# Patient Record
Sex: Female | Born: 1975 | Race: White | Hispanic: No | Marital: Married | Smoking: Never smoker
Health system: Southern US, Community
[De-identification: ages and names within clinical notes are randomized; demographics above are authoritative.]

## PROBLEM LIST (undated history)

## (undated) DIAGNOSIS — J302 Other seasonal allergic rhinitis: Secondary | ICD-10-CM

## (undated) DIAGNOSIS — E079 Disorder of thyroid, unspecified: Secondary | ICD-10-CM

## (undated) DIAGNOSIS — Z5189 Encounter for other specified aftercare: Secondary | ICD-10-CM

## (undated) DIAGNOSIS — R05 Cough: Principal | ICD-10-CM

## (undated) DIAGNOSIS — Z1379 Encounter for other screening for genetic and chromosomal anomalies: Principal | ICD-10-CM

## (undated) HISTORY — PX: HEMANGIOMA EXCISION: SHX1734

## (undated) HISTORY — PX: TONSILLECTOMY: SUR1361

## (undated) HISTORY — DX: Cough: R05

## (undated) HISTORY — DX: Encounter for other screening for genetic and chromosomal anomalies: Z13.79

## (undated) HISTORY — DX: Encounter for other specified aftercare: Z51.89

## (undated) HISTORY — DX: Other seasonal allergic rhinitis: J30.2

## (undated) HISTORY — PX: HEMORRHOID BANDING: SHX5850

## (undated) HISTORY — DX: Disorder of thyroid, unspecified: E07.9

---

## 1997-04-26 ENCOUNTER — Other Ambulatory Visit: Admission: RE | Admit: 1997-04-26 | Discharge: 1997-04-26 | Payer: Self-pay | Admitting: Obstetrics and Gynecology

## 1998-01-01 ENCOUNTER — Emergency Department (HOSPITAL_COMMUNITY): Admission: EM | Admit: 1998-01-01 | Discharge: 1998-01-01 | Payer: Self-pay | Admitting: Emergency Medicine

## 1998-05-28 ENCOUNTER — Emergency Department (HOSPITAL_COMMUNITY): Admission: EM | Admit: 1998-05-28 | Discharge: 1998-05-28 | Payer: Self-pay | Admitting: Emergency Medicine

## 1999-01-05 ENCOUNTER — Other Ambulatory Visit: Admission: RE | Admit: 1999-01-05 | Discharge: 1999-01-05 | Payer: Self-pay | Admitting: Obstetrics and Gynecology

## 1999-06-30 ENCOUNTER — Observation Stay (HOSPITAL_COMMUNITY): Admission: AD | Admit: 1999-06-30 | Discharge: 1999-07-01 | Payer: Self-pay | Admitting: Obstetrics & Gynecology

## 1999-07-01 ENCOUNTER — Encounter: Payer: Self-pay | Admitting: Obstetrics & Gynecology

## 1999-09-11 ENCOUNTER — Inpatient Hospital Stay (HOSPITAL_COMMUNITY): Admission: AD | Admit: 1999-09-11 | Discharge: 1999-09-14 | Payer: Self-pay | Admitting: Obstetrics and Gynecology

## 1999-09-16 ENCOUNTER — Encounter: Admission: RE | Admit: 1999-09-16 | Discharge: 1999-10-13 | Payer: Self-pay | Admitting: Obstetrics and Gynecology

## 1999-10-20 ENCOUNTER — Other Ambulatory Visit: Admission: RE | Admit: 1999-10-20 | Discharge: 1999-10-20 | Payer: Self-pay | Admitting: Obstetrics and Gynecology

## 2002-12-31 ENCOUNTER — Other Ambulatory Visit: Admission: RE | Admit: 2002-12-31 | Discharge: 2002-12-31 | Payer: Self-pay | Admitting: Obstetrics and Gynecology

## 2004-07-17 ENCOUNTER — Other Ambulatory Visit: Admission: RE | Admit: 2004-07-17 | Discharge: 2004-07-17 | Payer: Self-pay | Admitting: Obstetrics and Gynecology

## 2006-11-05 ENCOUNTER — Encounter: Admission: RE | Admit: 2006-11-05 | Discharge: 2006-11-05 | Payer: Self-pay | Admitting: Orthopedic Surgery

## 2007-06-15 ENCOUNTER — Inpatient Hospital Stay (HOSPITAL_COMMUNITY): Admission: AD | Admit: 2007-06-15 | Discharge: 2007-06-15 | Payer: Self-pay | Admitting: Obstetrics and Gynecology

## 2007-06-21 ENCOUNTER — Ambulatory Visit (HOSPITAL_COMMUNITY): Admission: RE | Admit: 2007-06-21 | Discharge: 2007-06-21 | Payer: Self-pay | Admitting: Obstetrics & Gynecology

## 2007-07-06 ENCOUNTER — Ambulatory Visit (HOSPITAL_COMMUNITY): Admission: RE | Admit: 2007-07-06 | Discharge: 2007-07-06 | Payer: Self-pay | Admitting: Obstetrics and Gynecology

## 2007-07-15 ENCOUNTER — Inpatient Hospital Stay (HOSPITAL_COMMUNITY): Admission: AD | Admit: 2007-07-15 | Discharge: 2007-07-15 | Payer: Self-pay | Admitting: Obstetrics and Gynecology

## 2007-07-20 ENCOUNTER — Ambulatory Visit (HOSPITAL_COMMUNITY): Admission: RE | Admit: 2007-07-20 | Discharge: 2007-07-20 | Payer: Self-pay | Admitting: Obstetrics and Gynecology

## 2007-07-30 ENCOUNTER — Inpatient Hospital Stay (HOSPITAL_COMMUNITY): Admission: AD | Admit: 2007-07-30 | Discharge: 2007-07-30 | Payer: Self-pay | Admitting: Obstetrics and Gynecology

## 2007-08-09 ENCOUNTER — Ambulatory Visit (HOSPITAL_COMMUNITY): Admission: RE | Admit: 2007-08-09 | Discharge: 2007-08-09 | Payer: Self-pay | Admitting: Obstetrics and Gynecology

## 2007-08-17 ENCOUNTER — Inpatient Hospital Stay (HOSPITAL_COMMUNITY): Admission: AD | Admit: 2007-08-17 | Discharge: 2007-09-18 | Payer: Self-pay | Admitting: Obstetrics and Gynecology

## 2007-08-17 ENCOUNTER — Ambulatory Visit: Payer: Self-pay | Admitting: Cardiology

## 2007-08-17 ENCOUNTER — Ambulatory Visit: Payer: Self-pay | Admitting: Pulmonary Disease

## 2007-08-18 ENCOUNTER — Encounter (HOSPITAL_COMMUNITY): Payer: Self-pay | Admitting: Obstetrics and Gynecology

## 2007-08-21 ENCOUNTER — Other Ambulatory Visit: Payer: Self-pay | Admitting: Family Medicine

## 2007-08-22 ENCOUNTER — Encounter (INDEPENDENT_AMBULATORY_CARE_PROVIDER_SITE_OTHER): Payer: Self-pay | Admitting: Obstetrics and Gynecology

## 2007-08-23 ENCOUNTER — Other Ambulatory Visit: Payer: Self-pay | Admitting: Family Medicine

## 2007-08-27 ENCOUNTER — Ambulatory Visit: Payer: Self-pay | Admitting: Surgery

## 2007-08-27 ENCOUNTER — Encounter (HOSPITAL_COMMUNITY): Payer: Self-pay | Admitting: Obstetrics and Gynecology

## 2007-08-28 ENCOUNTER — Encounter (HOSPITAL_COMMUNITY): Payer: Self-pay | Admitting: Obstetrics and Gynecology

## 2007-09-11 ENCOUNTER — Encounter (INDEPENDENT_AMBULATORY_CARE_PROVIDER_SITE_OTHER): Payer: Self-pay | Admitting: Obstetrics and Gynecology

## 2007-09-11 ENCOUNTER — Encounter: Payer: Self-pay | Admitting: Obstetrics and Gynecology

## 2007-09-19 ENCOUNTER — Encounter: Admission: RE | Admit: 2007-09-19 | Discharge: 2007-09-25 | Payer: Self-pay | Admitting: Obstetrics and Gynecology

## 2009-04-10 IMAGING — CR DG CHEST 2V
2 series · 2 of 2 positions shown · non-contrast
Comparison: PA and lateral chest 08/22/2007 and 08/21/2007.

CLINICAL DATA: Shortness of breath.

CHEST - 2 VIEW

[view not recorded (1 of 2)]
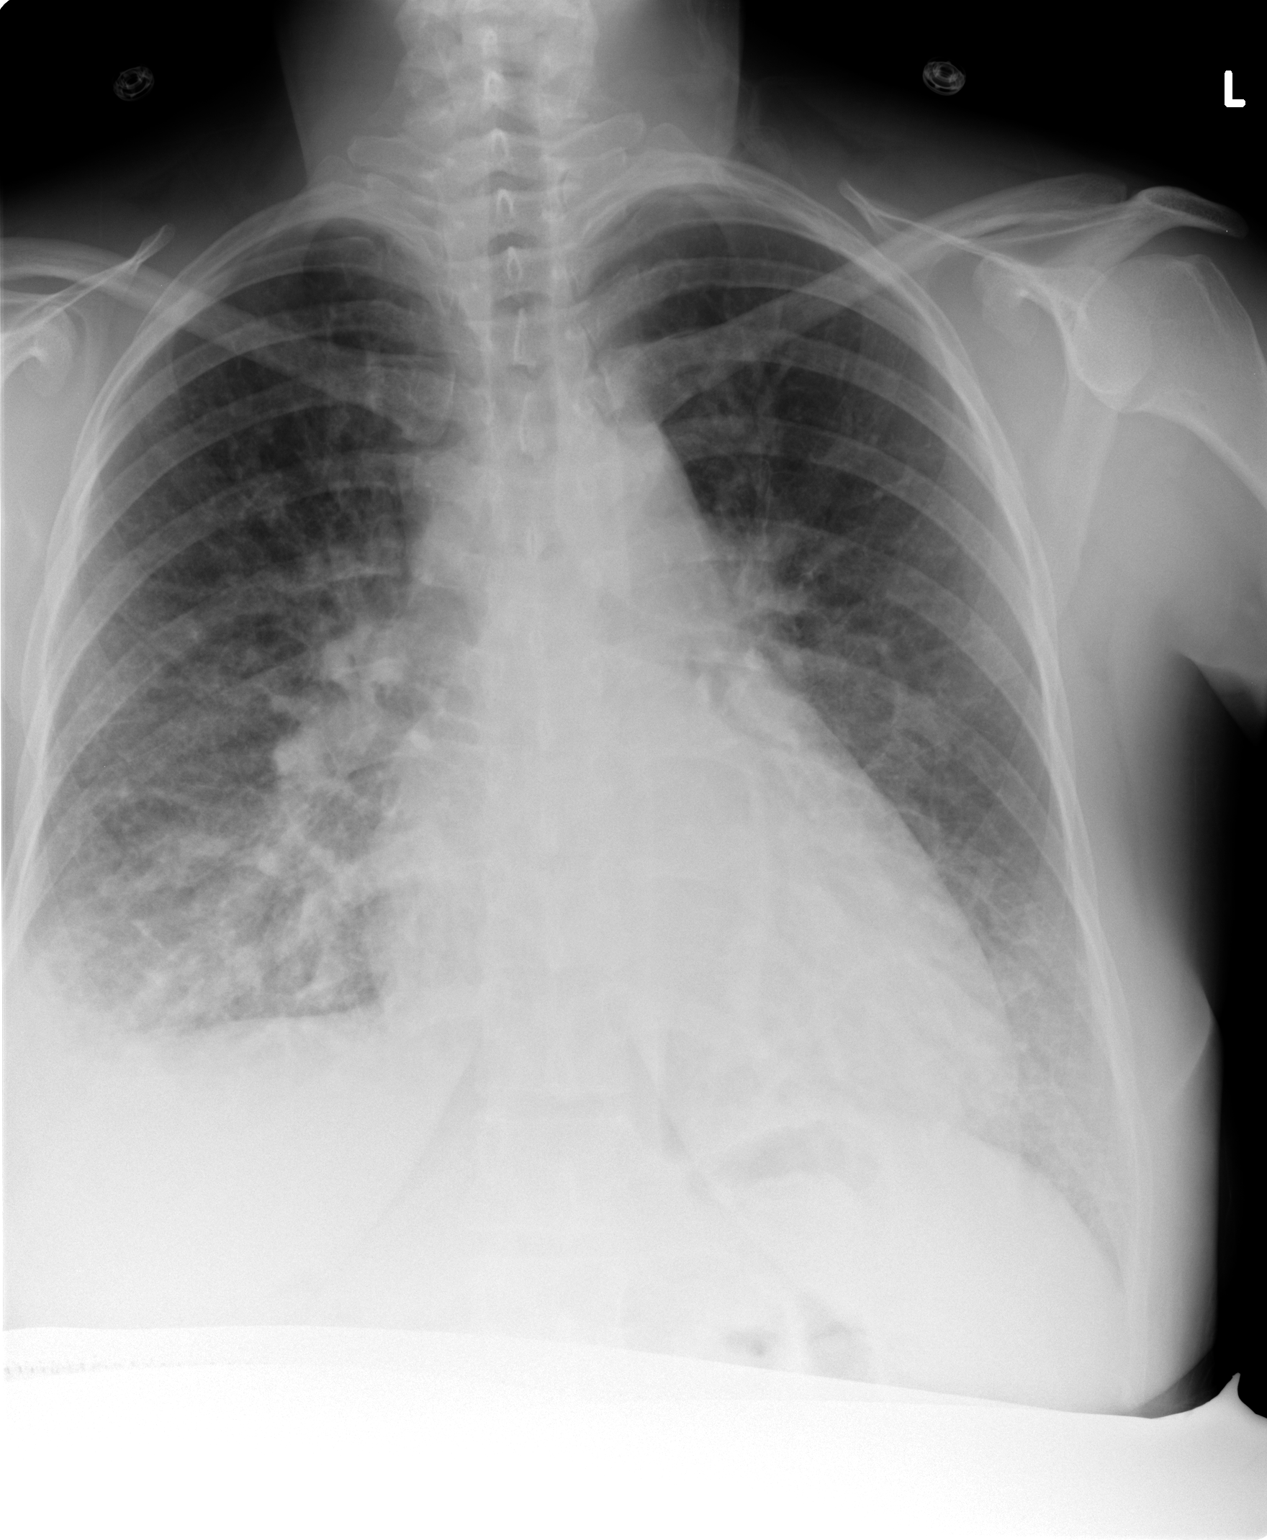

[view not recorded (2 of 2)]
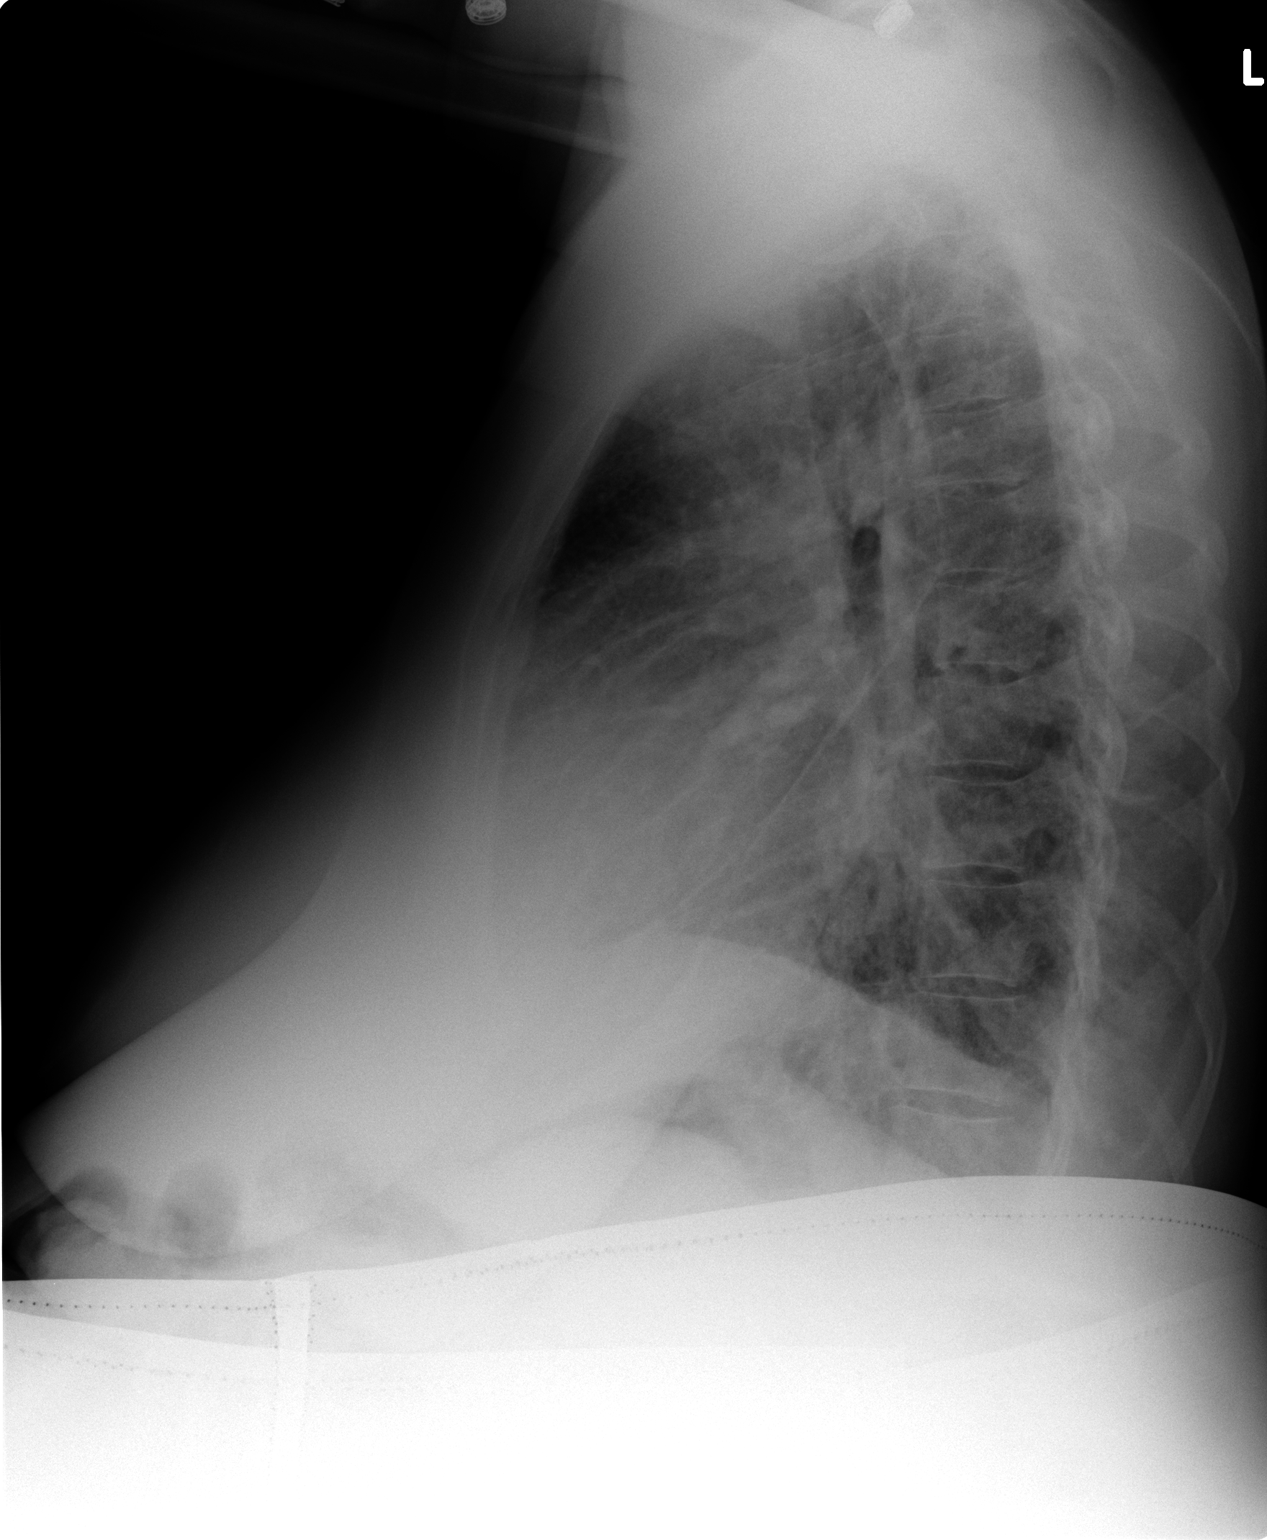

[2 of 2 positions shown; findings below may reference images not displayed]

FINDINGS: There has been marked worsening in bibasilar aeration,
particularly in the right lung base.  Cardiomegaly.  No effusion is
visualized.
IMPRESSION: Marked worsening of bibasilar aeration could represent increasing
pulmonary edema.  Focal opacity the right base is worrisome for
pneumonia.

## 2009-04-12 IMAGING — CR DG CHEST 2V
2 series · 2 of 2 positions shown · non-contrast
Comparison: 08/26/2007, 08/22/2007 and 08/21/2007

CLINICAL DATA: Pulmonary edema, 34 weeks pregnant.

CHEST - 2 VIEW

[view not recorded (1 of 2)]
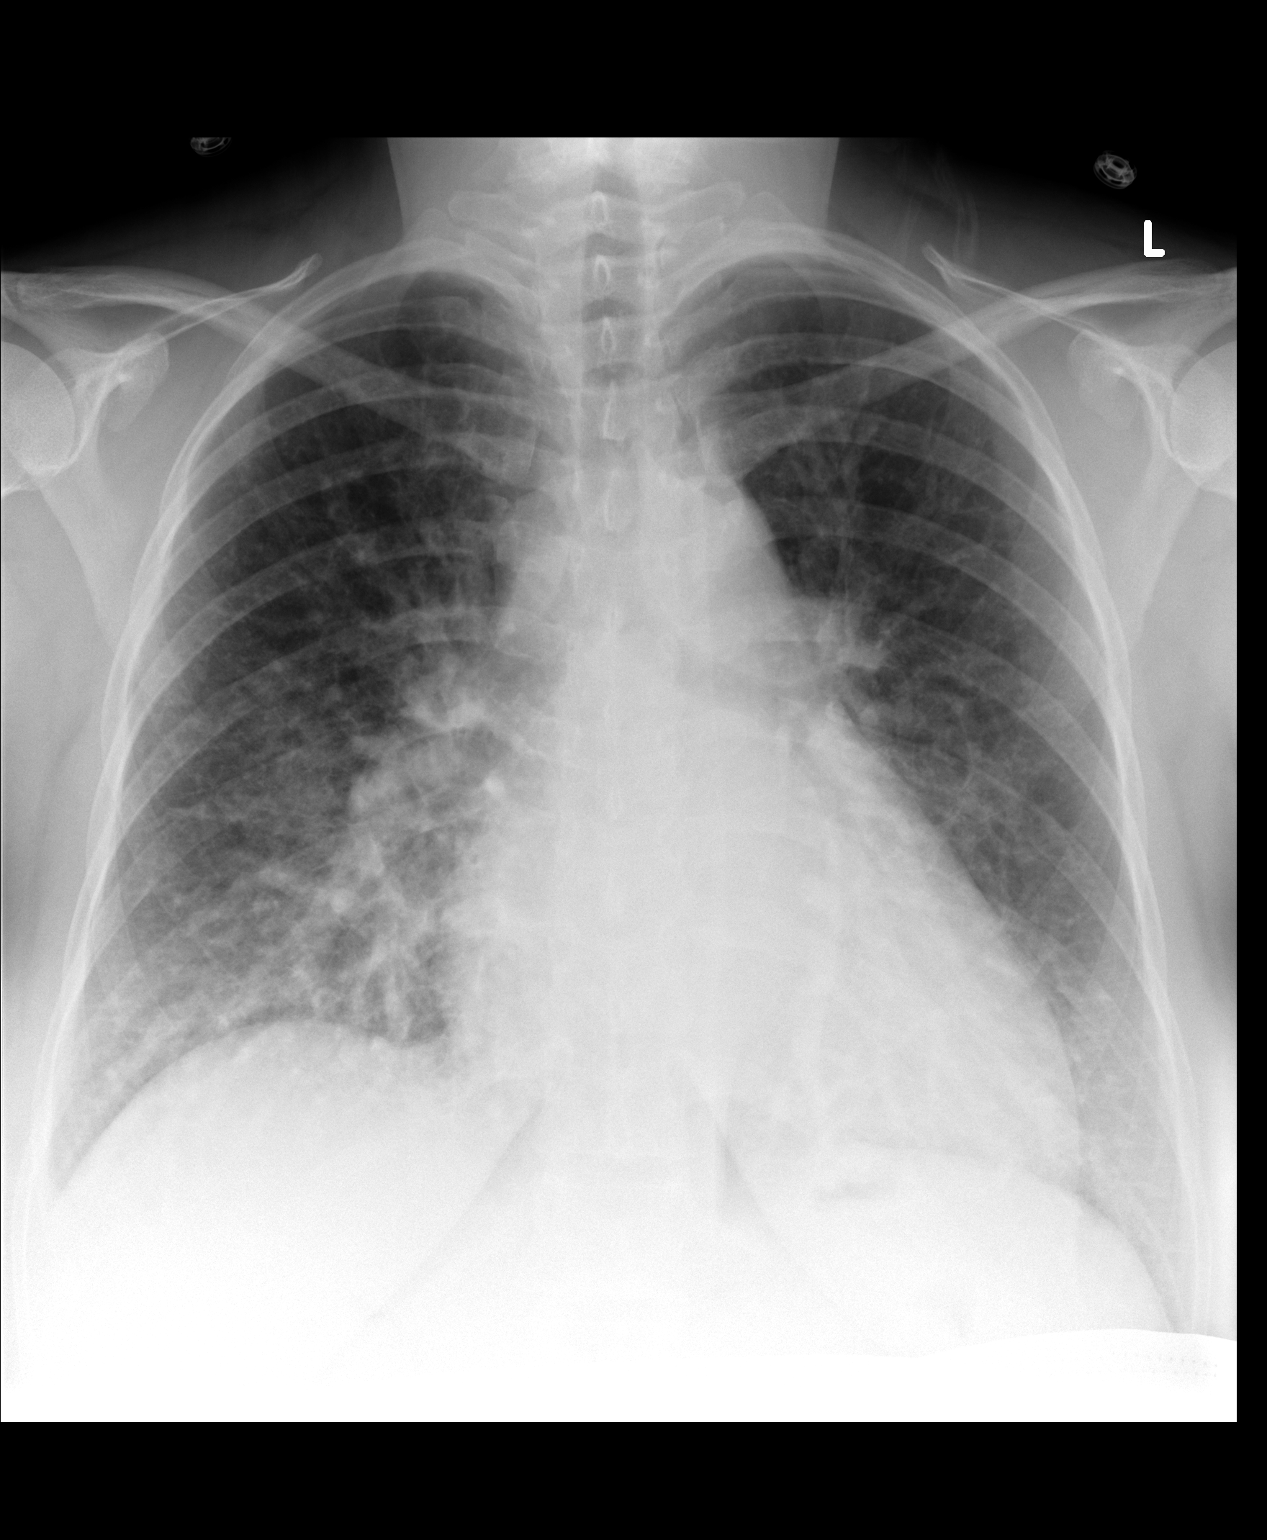

[view not recorded (2 of 2)]
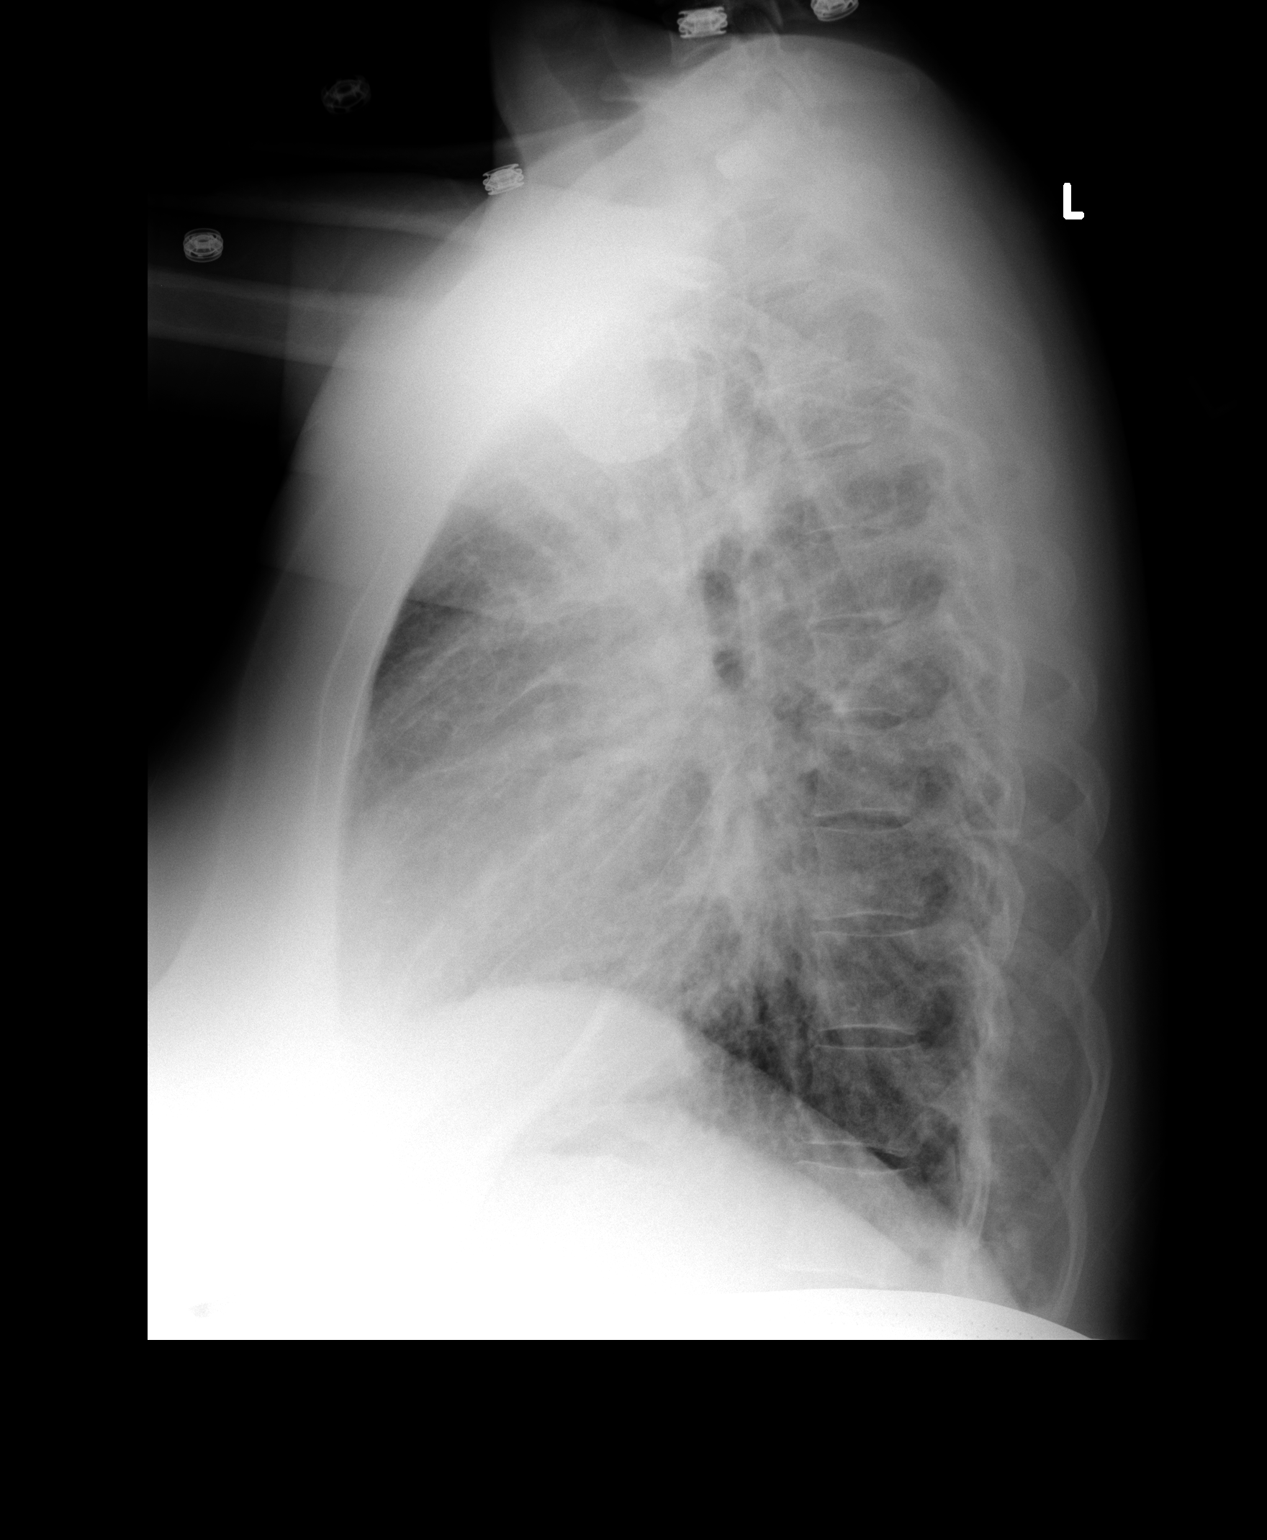

[2 of 2 positions shown; findings below may reference images not displayed]

FINDINGS: Trachea is midline.  Heart size stable.  There is
interstitial prominence and indistinctness, with basilar
predominance.  Pleural effusions have resolved.  Aeration in the
right lower lobe has improved somewhat in the interval.
IMPRESSION: Improving edema and effusions.

## 2009-04-29 IMAGING — CR DG CHEST 2V
2 series · 2 of 2 positions shown · non-contrast
Comparison: 08/28/2007

CLINICAL DATA: Post C-section.  Patient coughing shortness of
breath and green sputum.

CHEST - 2 VIEW

[view not recorded (1 of 2)]
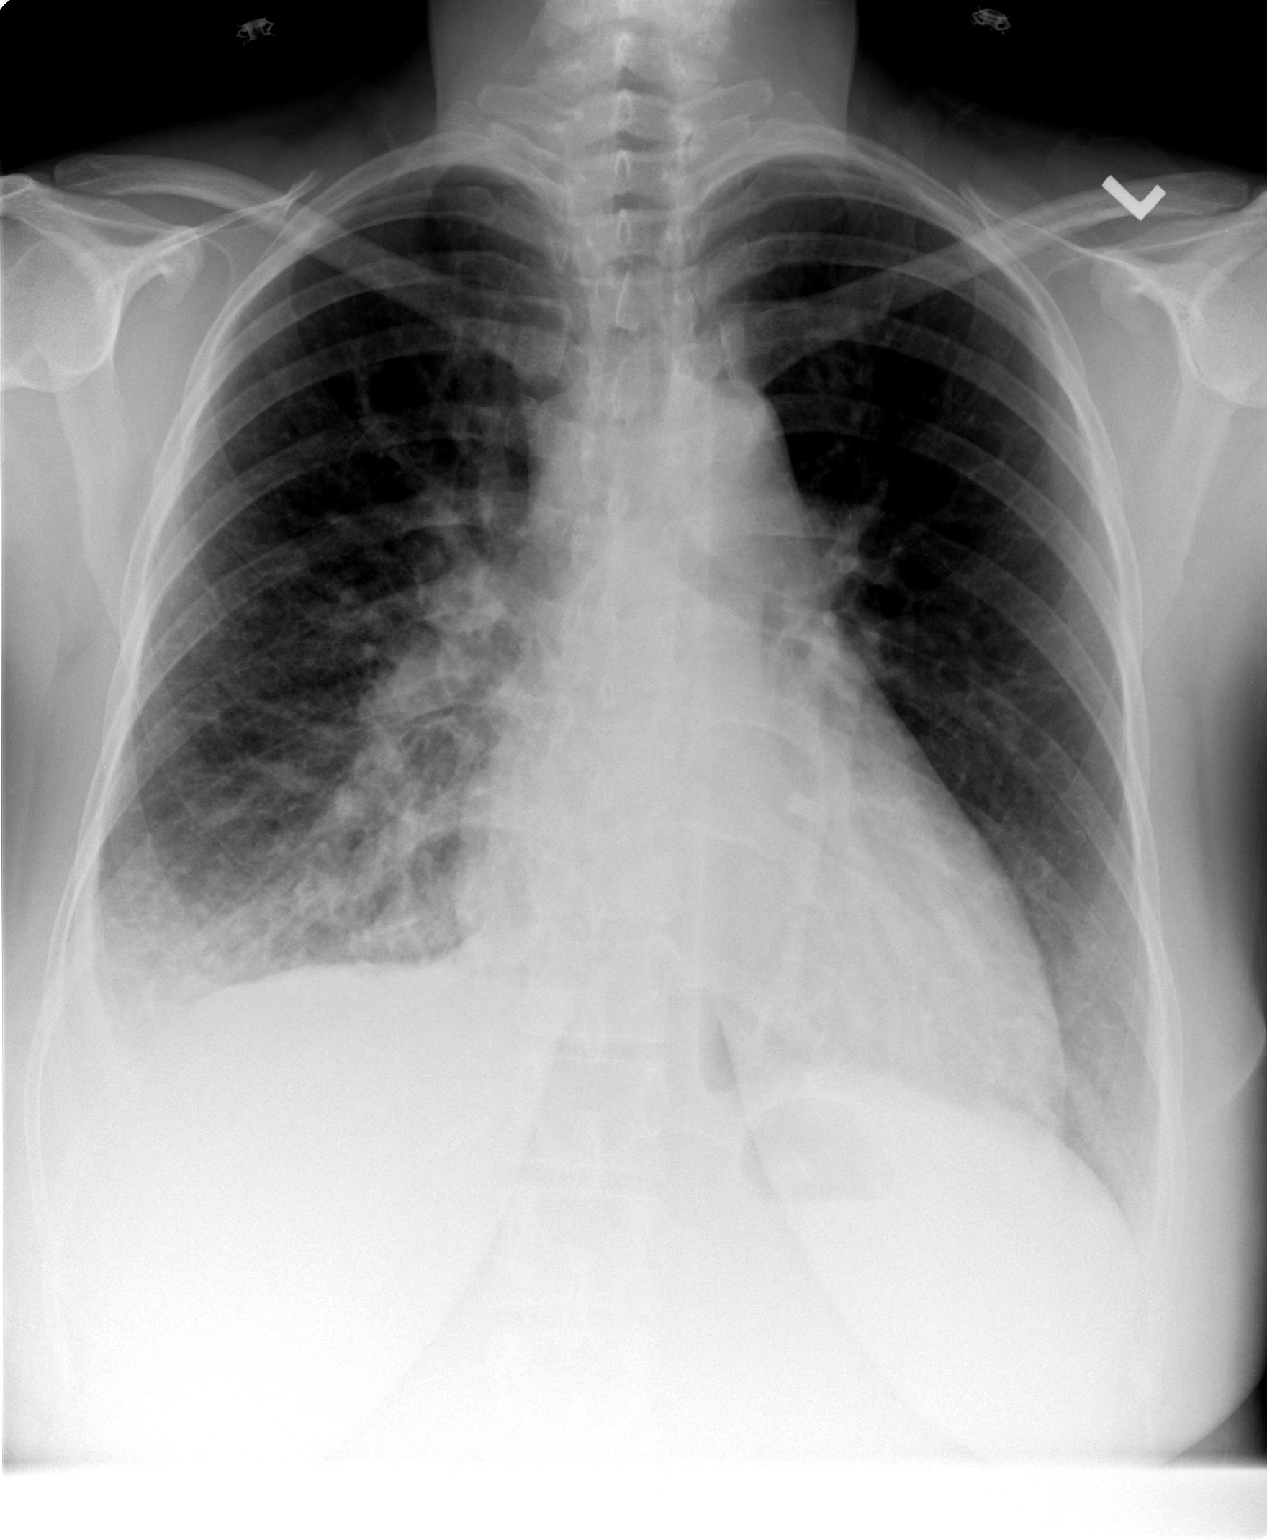

[view not recorded (2 of 2)]
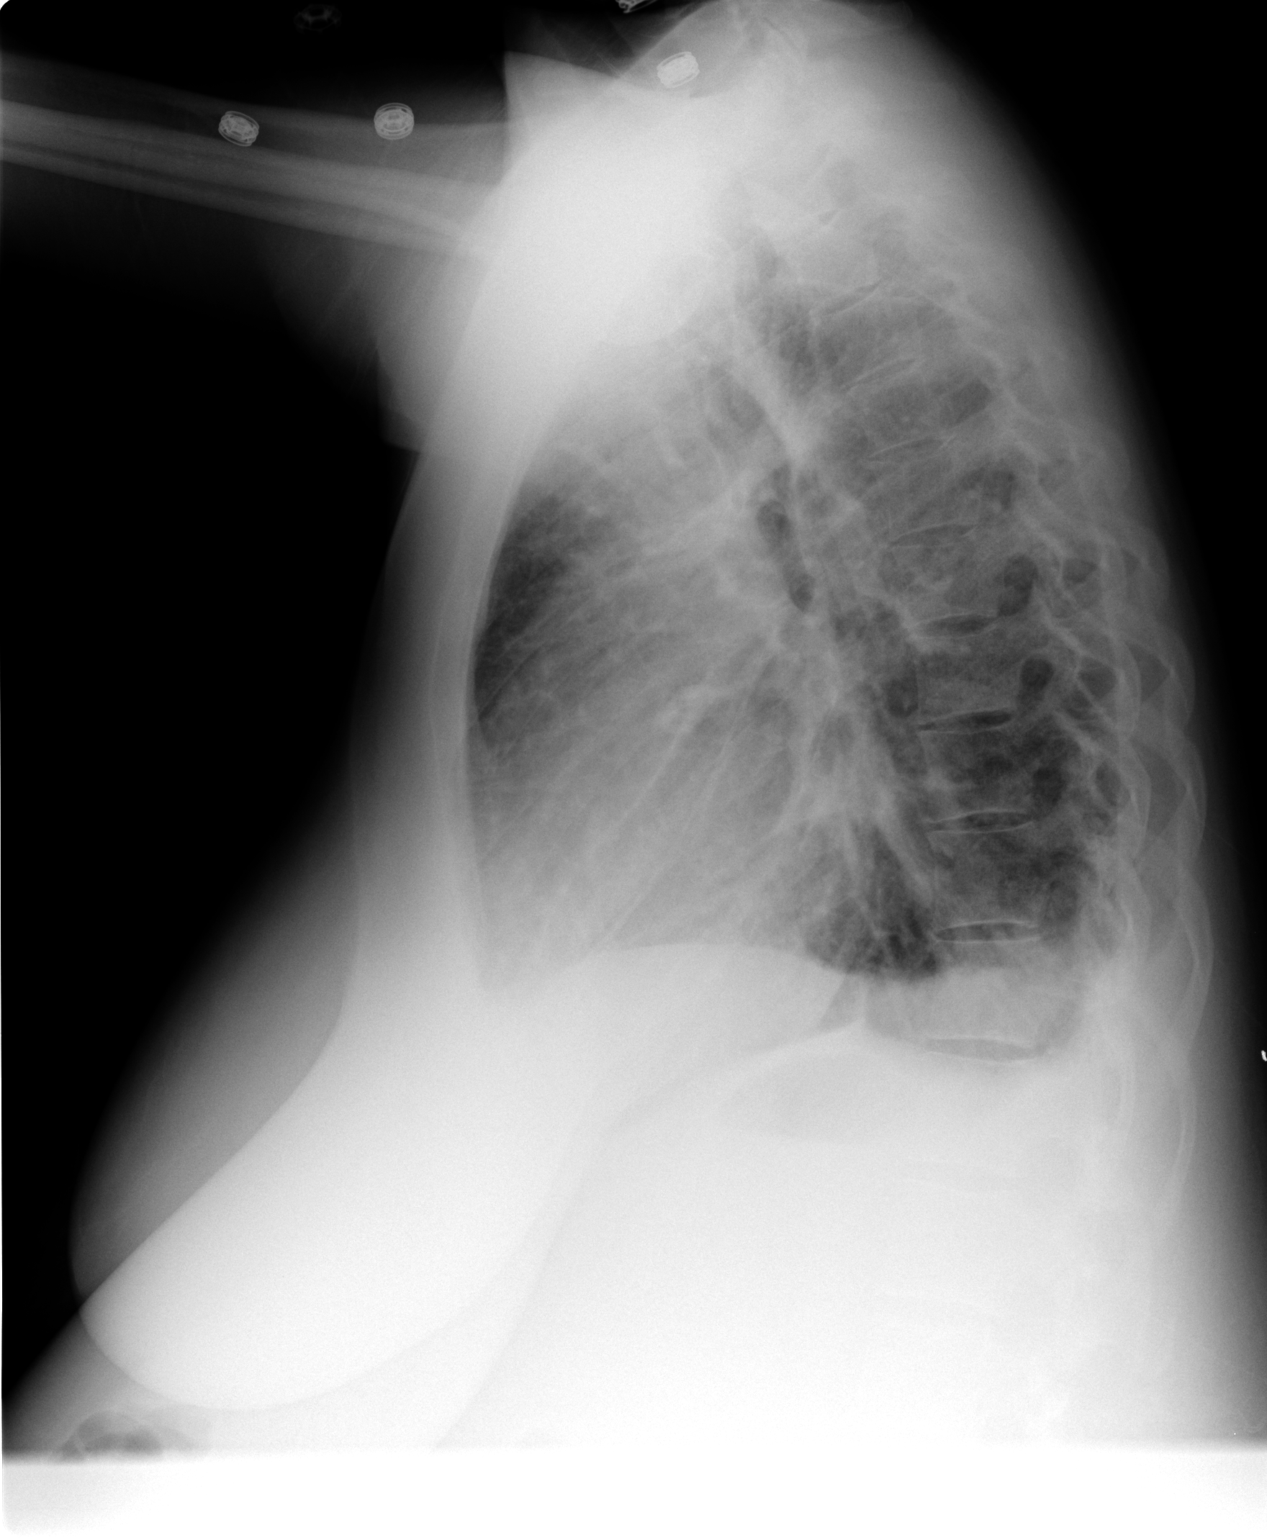

[2 of 2 positions shown; findings below may reference images not displayed]

FINDINGS: Mild cardiomegaly is again identified and is stable in
degree.  Bilateral pleural pleural effusions, right greater than
left are noted.  Focal alveolar infiltrate is identified at the
right lung base with associated septal lines in the appearance is
most compatible with asymmetric pulmonary edema.  Is the patient
lying with the right side down?  The possibility of a focal
pneumonia would be a secondary but felt less likely consideration
given the overall appearance.

Bony structures appear intact.
IMPRESSION: Bilateral pleural effusions with right basilar interstitial edema.
Probable asymmetric alveolar pulmonary edema at the right lung
base.  Please see above report for discussion.

## 2009-11-28 ENCOUNTER — Ambulatory Visit (HOSPITAL_COMMUNITY)
Admission: RE | Admit: 2009-11-28 | Discharge: 2009-11-28 | Payer: Self-pay | Source: Home / Self Care | Admitting: Obstetrics and Gynecology

## 2010-02-23 ENCOUNTER — Encounter: Payer: Self-pay | Admitting: Obstetrics and Gynecology

## 2010-04-15 LAB — HCG, SERUM, QUALITATIVE: Preg, Serum: NEGATIVE

## 2010-04-15 LAB — CBC
Platelets: 220 10*3/uL (ref 150–400)
RBC: 4.32 MIL/uL (ref 3.87–5.11)
RDW: 16.4 % — ABNORMAL HIGH (ref 11.5–15.5)
WBC: 5.2 10*3/uL (ref 4.0–10.5)

## 2010-06-15 ENCOUNTER — Other Ambulatory Visit: Payer: Self-pay | Admitting: Family Medicine

## 2010-06-15 ENCOUNTER — Ambulatory Visit
Admission: RE | Admit: 2010-06-15 | Discharge: 2010-06-15 | Disposition: A | Discharge status: Home / Self Care | Payer: PRIVATE HEALTH INSURANCE | Source: Ambulatory Visit | Attending: Family Medicine | Admitting: Family Medicine

## 2010-06-15 DIAGNOSIS — J189 Pneumonia, unspecified organism: Secondary | ICD-10-CM

## 2010-06-15 MED ORDER — IOHEXOL 300 MG/ML  SOLN
75.0000 mL | Freq: Once | INTRAMUSCULAR | Status: AC | PRN
  Administered 2010-06-15: 75 mL via INTRAVENOUS

## 2010-06-16 NOTE — Op Note (Signed)
NAME:  [name], [name]               ACCOUNT NO.:  [id number]   MEDICAL RECORD NO.:  [id number]           PATIENT TYPE:   LOCATION:                                FACILITY:  [facility]   PHYSICIAN:  Guy Sandifer. Henderson Cloud, M.D. DATE OF BIRTH:  [birth year]   DATE OF PROCEDURE:  DATE OF DISCHARGE:                               OPERATIVE REPORT   PREOPERATIVE DIAGNOSES:  1. Intrauterine pregnancy at 33-4/7 weeks estimated gestational age.  2. Fetal hydrops.   POSTOPERATIVE DIAGNOSIS:  1. Intrauterine pregnancy at 33-4/7 weeks estimated gestational age.  2. Fetal hydrops.   PROCEDURE:  Low-transverse cesarean section.   SURGEON:  Guy Sandifer. Henderson Cloud, MD   ASSISTANT:  Juluis Mire, MD   ANESTHESIA:  Spinal, Burnett Corrente, MD   SPECIMENS:  Placenta to pathology.  A section of umbilical cord for  karyotype, amniotic fluid for culture and sensitivity, cord blood for  culture and sensitivity to the laboratory.   FINDINGS:  Viable female infant, Apgars of 4, 4 and 7 at 1, 5 and 10  minutes respectively.  Arterial pH pending.  Birth weight pending.   ESTIMATED BLOOD LOSS:  1000 mL.   INDICATIONS AND CONSENT:  This patient is a 35 year old married white  female G71, P2, who was admitted on August 17, 2007, with polyhydramnios  and preterm uterine contractions.  The patient received betamethasone.  She initially had magnesium sulfate for labor tocolysis.  This was  followed by an episode of pulmonary edema.  She received approximately 2  units of packed red blood cells in transfusion.  The pulmonary edema  resolved.  The polyhydramnios continued.  She has been followed with  maternal fetal medicine.  Repeated ultrasounds have revealed normal  anatomy on the fetus.  The patient had declined karyotype and other  diagnostic measures on the baby.  Her last ultrasound on August 28, 2007,  revealed an estimated fetal weight of 4 pounds 2 ounces at the 78th  percentile.  Polyhydramnios continued.  Anatomy again  appeared normal.  Today's ultrasound revealed fetal hydrops.  Recommendation by maternal  fetal medicine, Dr. Ander Slade a was for immediate delivery.  This was  discussed with the patient.  Potential risks and complications were  discussed preoperatively including but not limited to infection, organ  damage, bleeding requiring transfusion of blood products with possible  HIV and hepatitis acquisition, DVT, PE, and pneumonia.  All questions  were answered and consent was signed on the chart.  CBC and metabolic  panel withdrawn.  The patient is crossmatch for 2 units of blood.   PROCEDURE:  The patient was taken immediately to the operating room  where she was identified, spinal anesthetic was placed and she was  placed in dorsal supine position with a 15-degree left lateral wedge.  The panniculus, which was edematous was elevated with tape.  She was  prepped.  Foley catheter was placed in the bladder as a drain, and she  was draped in a sterile fashion.  Pfannenstiel incision was made and  dissection was carried out through edematous adipose tissue down to the  peritoneum.  Peritoneum was incised and extended superiorly and  inferiorly.  Vesicouterine peritoneum was taken down cephalad laterally.  The bladder flap was developed and the bladder blade was placed.  Uterus  was incised in a low transverse manner.  The uterine cavity was entered  bluntly with a hemostat.  The uterine incision was extended cephalad  laterally with fingers.  Artificial rupture of membranes was carried out  for copious amounts of clear to straw-colored fluid.  The vertex which  was quite edematous was easily delivered through the uterine incision.  The baby was then delivered without difficulty.  Cord was clamped and  cut.  The baby handed to the waiting pediatrics team.  An approximately  1-inch section of umbilical cord was removed and will be sent for  karyotype.  Routine cord blood and cord gases withdrawn.   Amniotic fluid  as well as a cord blood sample were also obtained for cultures.  The  uterine cavity was clean.  Uterus was closed in 2 running locking  imbricating layers of 0-Monocryl suture, which achieved good hemostasis.  Tubes and ovaries were normal bilaterally.  Anterior peritoneum was  closed in running fashion with 0-Monocryl suture, which was also used to  reapproximate the pyramidalis muscle in the midline.  Anterior rectus  fascia was closed in a running fashion with 0-PDS loop suture.  A 2-0  plain suture was used to close the subcutaneous layer and the skin was  closed with clips.  All sponge, instrument, and needle counts were  correct and the patient was transferred to recovery room in stable  condition.      Guy Sandifer Henderson Cloud, M.D.  Electronically Signed     JET/MEDQ  D:  09/11/2007  T:  09/12/2007  Job:  [id number]

## 2010-06-19 NOTE — H&P (Signed)
[facility]  Patient:    [name], [name]                        MRN: [id number] Adm. Date:  06/30/99 Attending:  Freddy Finner, M.D.                         History and Physical  ADMITTING DIAGNOSES:          1.  Intrauterine pregnancy at [redacted] weeks                                   gestation.                               2.  Suspected left nephrolithiasis or left                                   ovarian torsion.  HISTORY OF PRESENT ILLNESS:   The patient is a 35 year old white married female, primigravida, who has had an uncomplicated prenatal course until today.  She did have some mild menstrual-like cramping on the day prior to her presentation, but was feeling better this morning.  She left the house to go pick strawberries and reported the sudden onset of severe left flank and left lower abdominal pain.  She went home, rested for awhile, and awakened with recurrence of even more severe pain.  She presented to the triage area of [facility] and was noted by the nurse to be writhing in pain on the bed, doubling up in the fetal position, and vomiting.  Her current review of systems is otherwise negative.  There are no cardiopulmonary, other GI, or other GU symptoms.  She specifically denies urinary frequency or dysuria.  She denies fever.  She denies diarrhea or change in bowel habits.  PAST MEDICAL HISTORY:         Her past medical history is recorded in detail in the prenatal summary and will not be repeated.  ALLERGIES:                    The patient is known to be allergic to [facility], which has produced jaundice.  No other known drug allergies.  CURRENT MEDICATIONS:          Her only known current medications are prenatal vitamins.  PHYSICAL EXAMINATION:  HEENT:                        Grossly within normal limits.  NECK:                         Thyroid gland is not palpably enlarged.  VITAL SIGNS:                  Blood pressure  in triage was 133/79, pulse 83 and regular.  Respirations 20.  Temperature 97.  CHEST:                        Clear to auscultation.  HEART:  Normal sinus rhythm, without murmurs, rubs, or gallops.  ABDOMEN:                      Gravid.  The uterine fundus is nontender.  There is mild left CVA tenderness.  The right CVA is normal.  CERVIX:                       Long, closed, and firm.  EXTREMITIES:                  Without cyanosis, clubbing, or edema.  ASSESSMENT:                   1.  Intrauterine pregnancy at [redacted] weeks                                   gestation.                               2.  Severe left flank and left lower abdominal                                   pain.  Suspect nephrolithiasis even though                                   urinalysis shows only a trace of urine                                   hemoglobin with all other findings                                   completely negative.  Another possibility                                   would be ovarian torsion.  PLAN:                         1.  Admission.                               2.  IV fluid hydration.                               3.  IV medications for analgesia as needed.                               4.  Straining of urine.                               5.  Obstetrical and abdominal ultrasounds in the  morning. DD:  06/30/99 TD:  06/30/99 Job: [id number]

## 2010-06-19 NOTE — Discharge Summary (Signed)
[name], [name]               ACCOUNT NO.:  [id number]   MEDICAL RECORD NO.:  [id number]          PATIENT TYPE:  INP   LOCATION:  9309                          FACILITY:  [facility]   PHYSICIAN:  Zelphia Cairo, MD    DATE OF BIRTH:  [birth year]   DATE OF ADMISSION:  08/17/2007  DATE OF DISCHARGE:  09/18/2007                               DISCHARGE SUMMARY   ADMITTING DIAGNOSES:  1. Intrauterine pregnancy at 32 weeks' estimated gestational age.  2. Polyhydramnios.  3. Preterm labor.   DISCHARGE DIAGNOSES:  1. Status post low-transverse cesarean section secondary to fetal      hydrops.  2. Viable female infant.  3. Pulmonary edema, resolving.   REASON FOR ADMISSION:  Please see written H&P.   HOSPITAL COURSE:  The patient is a 35 year old white married female  gravida 4, para 2 that was admitted to [facility] at 28  weeks' estimated gestational age with polyhydramnios and preterm uterine  contractions.  The patient did receive betamethasone.  She will  initially was placed on magnesium sulfate for labor tocolysis.  This was  followed by an episode of pulmonary edema.  The patient did receive 2  units of packed red blood cells and the pulmonary edema did resolve.  The polyhydramnios continued and she was being also followed by Maternal  Fetal Medicine.  Repeated ultrasounds revealed normal anatomy on the  fetus; however, the patient had declined karyotyping other diagnostic  measures on the baby.  Each ultrasound continued to show a normal fetal  anatomy; however, the polyhydramnios continued and on the day of  delivery the ultrasound had revealed fetal hydrops and recommendation by  Maternal Fetal Medicine was for immediate delivery.  This was discussed  with the patient and she was later transferred to the operating room  where spinal anesthesia was administered without difficulty.  A low-  transverse incision was made with delivery of a viable female infant  weighing 8 pounds 5 ounces, Apgars of 4 at one minute, 4 at five  minutes, and 7 at ten minutes.  Placenta was sent for pathology and a  section of the umbilical cord was also sent for karyotyping.  The  patient tolerated the procedure well and was then taken to the recovery  room in stable condition.  On postoperative day #1, the patient was  resting comfortably.  Baby was in the NICU.  Vital signs were stable  with good urine output.  Oxygen saturation was 95% on 2 L.  Abdomen soft  and nontender.  Abdominal dressing noted to be clean, dry, and intact.  Extremities were noted to have 3+ lower extremity edema.  SGOT and SGPT  were within normal limits.  Hemoglobin was 8.4, platelet count 126,000,  and WBC count of 11.2.  The patient was given some Lasix for peripheral  edema and a CMET was ordered.  On postoperative day #2, the patient did  complain of some soreness.  She was tolerating a regular diet.  Vital  signs were stable.  Abdomen soft.  Abdominal dressing was noted to be  clean, dry,  and intact.  On postoperative day #3, the patient did  complain of a productive cough with some green sputum.  She denied any  shortness of breath.  Vital signs were stable.  Blood pressure was  114/71 to 111/65.  Lungs were clear; however, there was some noted  decreased breath sounds in the right lower lobe.  Abdomen soft.  Fundus  firm and nontender.  Edema was noted now in the lower abdomen superior  to the incisional site and sacral edema was also noted.  Concern was for  possible pleural effusion and chest x-ray PA and lateral were ordered.  CBC and CMET and O2 sats later that evening.  Chest x-ray had shown  there were some bilateral pleural effusions and right basilar  interstitial edema.  Laboratory findings revealed hemoglobin of 9.3,  platelet count of 166,000, and WBC count of 8.5.  Sodium was 137,  potassium 4.0, AST was 43, ALT was 18, and albumin was 1.7.  Temperature  was 99, blood  pressure 118/71, pulse 78, and respirations 18-20.  The  patient was not in any acute distress.  Lungs were clear to  auscultation.  Nephrologist, Dr. Lowell Guitar was contacted and the patient  was ordered Lasix 40 mg IV push every 6 hours.  On the following  morning, the patient did complain of some dysuria.  She denied headache  or blurred vision.  Coughing to be somewhat improved.  Vital signs were  stable with blood pressure 121/74 to 128/76.  Deep tendon reflexes were  3+ with 1 beat of clonus bilaterally.  Abdomen was soft.  Fundus firm  and nontender.  Lungs continue to be clear with some decreased breath  sounds noted in the right lower lobe.  Abdomen was soft.  Laboratory  findings revealed hemoglobin of 8.8, platelet count of 155,000, and WBC  of 7.1.  AST was 73 and ALT was 22.  Potassium was 4.1 and sodium 137.  Urine was sent for culture and CBC and CMET were ordered for the  following morning.  On postoperative day #3, the patient was without  complaint.  Vital signs were stable.  Blood pressure 109/68.  Abdomen  soft.  Fundus firm and nontender.  Edema seemed to be somewhat better.  Labs revealed SGOT of 52 and potassium 3.4.  The patient continued on  the Lasix.  She was now given potassium 20 mEq every day.  Renal consult  continued.  On the following morning, the patient did complain of some  incisional drainage.  Vital signs were stable.  She was afebrile.  Fundus firm and nontender.  Some slight erythema was noted at the  incisional site.  The following morning, the patient did complain of  some fatigue.  Vital signs were stable.  Blood pressure 108/70.  Abdomen  soft.  Fundus firm and nontender.  Incision was noted to have some small  oozing.  There were some erythema and edema noted superior to the  incisional site.  Lungs were clear to auscultation.  Lower extremity  edema was noted now to be 2+.  Urinalysis had revealed possible urinary  tract infection.  Instructions  were given, and the patient was later  discharged home.   CONDITION ON DISCHARGE:  Stable.   DIET:  Regular as tolerated.   ACTIVITY:  No heavy lifting.  No driving x2 weeks.  No vaginal entry.   FOLLOWUP:  The patient to follow up in the office in 2-3 days for  laboratory findings  and an incision check.  She is to call for  temperature greater than 100 degrees, persistent nausea, vomiting, heavy  vaginal bleeding, and/or redness or further drainage from the incisional  site.   DISCHARGE MEDICATIONS:  1. K-Dur 1 p.o. daily.  2. Percocet 5/325, #30 one p.o. every 4-6 hours p.r.n.  3. Motrin 600 mg every 6 hours.  4. Lasix 40 mg 1  p.o. daily.  5. Cipro 500 mg 1 p.o. b.i.d. x7 days.      Julio Sicks, N.P.      Zelphia Cairo, MD  Electronically Signed    CC/MEDQ  D:  10/06/2007  T:  10/06/2007  Job:  [id number]

## 2010-08-06 ENCOUNTER — Ambulatory Visit (INDEPENDENT_AMBULATORY_CARE_PROVIDER_SITE_OTHER): Payer: PRIVATE HEALTH INSURANCE | Admitting: Internal Medicine

## 2010-08-06 ENCOUNTER — Encounter: Payer: Self-pay | Admitting: Internal Medicine

## 2010-08-06 DIAGNOSIS — J984 Other disorders of lung: Secondary | ICD-10-CM

## 2010-08-06 DIAGNOSIS — R059 Cough, unspecified: Secondary | ICD-10-CM

## 2010-08-06 DIAGNOSIS — R911 Solitary pulmonary nodule: Secondary | ICD-10-CM

## 2010-08-06 DIAGNOSIS — R05 Cough: Secondary | ICD-10-CM

## 2010-08-06 NOTE — Progress Notes (Signed)
Subjective:     Patient ID: [name], female   DOB: 03/18/1975, 35 y.o.   MRN: [id number]  HPI 24 yowf never smoker and no prior resp/ allergic issues until early May 2012    08/06/2010 Initial pulmonary office eval  new onset  May  2012 congestion x 5 days >  [facility] dx as bronchitis,  rx with bactrim, albuterol and prednisone x 5 > 0 better, then went to  [facility]  06/10/10 rx with avelox x 10 days plus shot of celestone/ rocephin and some better then returned to [facility] physicians on [address]  Dx with ct chest neg x for 5 mm nodule.  Gradually all her reps symptoms have resolved. No residual cough/ congestion or need for any medications  Pt denies any significant sore throat, dysphagia, itching, sneezing,  nasal congestion or excess/ purulent secretions,  fever, chills, sweats, unintended wt loss, pleuritic or exertional cp, hempoptysis, orthopnea pnd or leg swelling.    Also denies any obvious fluctuation of symptoms with weather or environmental changes or other aggravating or alleviating factors.        Review of Systems     Objective:   Physical Exam    amb wf nad  08/07/10 wt 163  HEENT: nl dentition, turbinates, and orophanx. Nl external ear canals without cough reflex   NECK :  without JVD/Nodes/TM/ nl carotid upstrokes bilaterally   LUNGS: no acc muscle use, clear to A and P bilaterally without cough on insp or exp maneuvers   CV:  RRR  no s3 or murmur or increase in P2, no edema   ABD:  soft and nontender with nl excursion in the supine position. No bruits or organomegaly, bowel sounds nl  MS:  warm without deformities, calf tenderness, cyanosis or clubbing  SKIN: warm and dry without lesions    NEURO:  alert, approp, no deficits   Assessment:         Plan:

## 2010-08-06 NOTE — Patient Instructions (Signed)
[name] exposure should be minimized - your condition sounds like it might have been hypersensitivity pneumonitis or Pidgeon Breeder's disease or psiticosis - all of which can be seen in patients exposed to birds, even if they appear healthy  You need a CT scan in May 2013 limited to the Right lower lobe 5mm nodule which is way too small biopsy and likely benign.

## 2010-08-08 ENCOUNTER — Encounter: Payer: Self-pay | Admitting: Internal Medicine

## 2010-08-08 DIAGNOSIS — R059 Cough, unspecified: Secondary | ICD-10-CM | POA: Insufficient documentation

## 2010-08-08 DIAGNOSIS — R05 Cough: Secondary | ICD-10-CM | POA: Insufficient documentation

## 2010-08-08 DIAGNOSIS — R911 Solitary pulmonary nodule: Secondary | ICD-10-CM | POA: Insufficient documentation

## 2010-08-08 HISTORY — DX: Cough, unspecified: R05.9

## 2010-08-08 NOTE — Assessment & Plan Note (Signed)
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

## 2010-08-08 NOTE — Assessment & Plan Note (Signed)
Discussed in detail all the  indications, usual  risks and alternatives  relative to the benefits with patient who agrees to proceed with conservative f/u @ one year unless new symptoms as very low risk this is malignancy.

## 2010-10-28 LAB — URINALYSIS, ROUTINE W REFLEX MICROSCOPIC
Nitrite: NEGATIVE
Protein, ur: NEGATIVE
Specific Gravity, Urine: <1.005 — ABNORMAL LOW
Urobilinogen, UA: 0.2

## 2010-10-28 LAB — URINE MICROSCOPIC-ADD ON

## 2010-10-30 LAB — B-NATRIURETIC PEPTIDE (CONVERTED LAB): Pro B Natriuretic peptide (BNP): 231 — ABNORMAL HIGH

## 2010-10-30 LAB — URINALYSIS, ROUTINE W REFLEX MICROSCOPIC
Glucose, UA: NEGATIVE
Glucose, UA: NEGATIVE
Hgb urine dipstick: NEGATIVE
Ketones, ur: >80 — AB
Ketones, ur: NEGATIVE
Nitrite: NEGATIVE
Protein, ur: NEGATIVE
Urobilinogen, UA: 0.2
pH: 5.5

## 2010-10-30 LAB — COMPREHENSIVE METABOLIC PANEL
ALT: 14
ALT: 17
ALT: 18
ALT: 14
ALT: 15
ALT: 22
AST: 27
AST: 29
AST: 31
AST: 35
AST: 43 — ABNORMAL HIGH
Albumin: 1.9 — ABNORMAL LOW
Albumin: 1.9 — ABNORMAL LOW
Albumin: 2.1 — ABNORMAL LOW
Albumin: 1.5 — ABNORMAL LOW
Albumin: 1.7 — ABNORMAL LOW
Albumin: 1.8 — ABNORMAL LOW
Alkaline Phosphatase: 115
Alkaline Phosphatase: 75
Alkaline Phosphatase: 87
BUN: 14
BUN: 17
BUN: 11
BUN: 12
BUN: 14
CO2: 23
CO2: 22
CO2: 25
CO2: 27
Calcium: 7.7 — ABNORMAL LOW
Calcium: 8.1 — ABNORMAL LOW
Calcium: 8.5
Calcium: 7.9 — ABNORMAL LOW
Chloride: 108
Chloride: 112
Chloride: 107
Creatinine, Ser: 0.7
Creatinine, Ser: 0.75
Creatinine, Ser: 0.85
Creatinine, Ser: 0.71
Creatinine, Ser: 0.73
GFR calc Af Amer: >60
GFR calc Af Amer: >60
GFR calc Af Amer: >60
GFR calc Af Amer: >60
GFR calc Af Amer: >60
GFR calc non Af Amer: >60
GFR calc non Af Amer: >60
GFR calc non Af Amer: >60
GFR calc non Af Amer: >60
GFR calc non Af Amer: >60
Glucose, Bld: 87
Glucose, Bld: 89
Glucose, Bld: 74
Glucose, Bld: 79
Glucose, Bld: 96
Potassium: 3.4 — ABNORMAL LOW
Potassium: 4.2
Potassium: 3.9
Potassium: 4.1
Potassium: 4.4
Sodium: 138
Sodium: 139
Sodium: 140
Sodium: 136
Sodium: 137
Sodium: 137
Total Bilirubin: 0.6
Total Bilirubin: 0.9
Total Bilirubin: 0.6
Total Protein: 4.7 — ABNORMAL LOW
Total Protein: 4.8 — ABNORMAL LOW
Total Protein: 5.1 — ABNORMAL LOW
Total Protein: 5.5 — ABNORMAL LOW
Total Protein: 4.7 — ABNORMAL LOW
Total Protein: 4.8 — ABNORMAL LOW

## 2010-10-30 LAB — DIFFERENTIAL
Basophils Relative: 0
Eosinophils Relative: 0
Lymphs Abs: 1.8
Lymphs Abs: 1.9
Monocytes Absolute: 1.2 — ABNORMAL HIGH
Monocytes Relative: 11
Neutro Abs: 8 — ABNORMAL HIGH
Neutrophils Relative %: 72

## 2010-10-30 LAB — CBC
HCT: 27.5 — ABNORMAL LOW
HCT: 25.7 — ABNORMAL LOW
HCT: 26.7 — ABNORMAL LOW
HCT: 27.7 — ABNORMAL LOW
Hemoglobin: 9.1 — ABNORMAL LOW
Hemoglobin: 9.9 — ABNORMAL LOW
Hemoglobin: 10.3 — ABNORMAL LOW
Hemoglobin: 8.4 — ABNORMAL LOW
Hemoglobin: 8.8 — ABNORMAL LOW
MCHC: 32.7
MCHC: 32.7
MCHC: 32.7
MCHC: 33.1
MCHC: 33
MCHC: 33.4
MCV: 83.1
MCV: 84.1
MCV: 87.1
MCV: 87.3
Platelets: 175
Platelets: 184
Platelets: 201
Platelets: 240
Platelets: 126 — ABNORMAL LOW
Platelets: 166
RBC: 2.99 — ABNORMAL LOW
RBC: 3.19 — ABNORMAL LOW
RBC: 3.06 — ABNORMAL LOW
RBC: 3.63 — ABNORMAL LOW
RDW: 19.5 — ABNORMAL HIGH
RDW: 19.8 — ABNORMAL HIGH
RDW: 21.3 — ABNORMAL HIGH
RDW: 24.1 — ABNORMAL HIGH
WBC: 11.8 — ABNORMAL HIGH
WBC: 11.2 — ABNORMAL HIGH

## 2010-10-30 LAB — CROSSMATCH
ABO/RH(D): O POS
ABO/RH(D): O POS

## 2010-10-30 LAB — STREP B DNA PROBE

## 2010-10-30 LAB — BLOOD GAS, ARTERIAL
Bicarbonate: 21.5
pCO2 arterial: 28.7 — ABNORMAL LOW
pH, Arterial: 7.486 — ABNORMAL HIGH
pO2, Arterial: 71.4 — ABNORMAL LOW

## 2010-10-30 LAB — MAGNESIUM: Magnesium: 5.5 — ABNORMAL HIGH

## 2010-10-30 LAB — URINE CULTURE
Colony Count: NO GROWTH
Colony Count: 75000
Special Requests: NEGATIVE

## 2010-10-30 LAB — BODY FLUID CULTURE: Gram Stain: NONE SEEN

## 2010-10-30 LAB — APTT: aPTT: 31

## 2010-10-30 LAB — URINE MICROSCOPIC-ADD ON

## 2010-10-30 LAB — ANAEROBIC CULTURE

## 2010-10-30 LAB — ABO/RH: ABO/RH(D): O POS

## 2010-10-30 LAB — PROTIME-INR: INR: 1.1

## 2011-05-21 ENCOUNTER — Telehealth: Payer: Self-pay | Admitting: *Deleted

## 2011-05-21 DIAGNOSIS — R911 Solitary pulmonary nodule: Secondary | ICD-10-CM

## 2011-05-21 NOTE — Telephone Encounter (Signed)
Message copied by Christen Butter on Fri May 21, 2011  5:05 PM ------      Message from: Christen Butter      Created: Wed Aug 19, 2010  4:38 PM       Pt need ct chest f/u nodule May 2013

## 2011-05-21 NOTE — Telephone Encounter (Signed)
Spoke with pt and notified she is due for CT next month.  She verbalized understanding and denied any questions. Order placed to [facility].

## 2011-06-11 ENCOUNTER — Encounter (HOSPITAL_COMMUNITY): Payer: Self-pay | Admitting: Emergency Medicine

## 2011-06-11 ENCOUNTER — Emergency Department (HOSPITAL_COMMUNITY): Payer: Managed Care, Other (non HMO)

## 2011-06-11 ENCOUNTER — Emergency Department (HOSPITAL_COMMUNITY)
Admission: EM | Admit: 2011-06-11 | Discharge: 2011-06-11 | Disposition: A | Discharge status: Home / Self Care | Payer: Managed Care, Other (non HMO) | Attending: Emergency Medicine | Admitting: Emergency Medicine

## 2011-06-11 DIAGNOSIS — R209 Unspecified disturbances of skin sensation: Secondary | ICD-10-CM | POA: Insufficient documentation

## 2011-06-11 DIAGNOSIS — R110 Nausea: Secondary | ICD-10-CM | POA: Insufficient documentation

## 2011-06-11 DIAGNOSIS — H538 Other visual disturbances: Secondary | ICD-10-CM | POA: Insufficient documentation

## 2011-06-11 DIAGNOSIS — R42 Dizziness and giddiness: Secondary | ICD-10-CM | POA: Insufficient documentation

## 2011-06-11 DIAGNOSIS — J329 Chronic sinusitis, unspecified: Secondary | ICD-10-CM

## 2011-06-11 LAB — POCT I-STAT, CHEM 8
Calcium, Ion: 1.26 mmol/L (ref 1.12–1.32)
Chloride: 106 mEq/L (ref 96–112)
Glucose, Bld: 112 mg/dL — ABNORMAL HIGH (ref 70–99)
HCT: 44 % (ref 36.0–46.0)
TCO2: 26 mmol/L (ref 0–100)

## 2011-06-11 LAB — PREGNANCY, URINE: Preg Test, Ur: NEGATIVE

## 2011-06-11 LAB — URINALYSIS, ROUTINE W REFLEX MICROSCOPIC
Glucose, UA: NEGATIVE mg/dL
Ketones, ur: NEGATIVE mg/dL
Leukocytes, UA: NEGATIVE
Protein, ur: NEGATIVE mg/dL
Urobilinogen, UA: 0.2 mg/dL (ref 0.0–1.0)

## 2011-06-11 MED ORDER — SODIUM CHLORIDE 0.9 % IV BOLUS (SEPSIS)
1000.0000 mL | Freq: Once | INTRAVENOUS | Status: AC
  Administered 2011-06-11: 1000 mL via INTRAVENOUS

## 2011-06-11 MED ORDER — MECLIZINE HCL 50 MG PO TABS: 25.0000 mg | ORAL_TABLET | Freq: Three times a day (TID) | ORAL | Status: AC | PRN

## 2011-06-11 MED ORDER — AMOXICILLIN-POT CLAVULANATE 875-125 MG PO TABS: 1.0000 | ORAL_TABLET | Freq: Two times a day (BID) | ORAL | Status: AC

## 2011-06-11 NOTE — ED Notes (Addendum)
Report received, assumed care. Reports visual disturbances improved but continues. Denies dizziness "as long as I focus on one area to look at". Denies any pain. Awaiting MRI

## 2011-06-11 NOTE — ED Notes (Signed)
Pt sts change in vision x 5 days with some "swirling" and trouble focusing when going from right to left; pt denies obvious dizziness; pt sts no balance problems or HA

## 2011-06-11 NOTE — ED Provider Notes (Signed)
Medical screening examination/treatment/procedure(s) were conducted as a shared visit with non-physician practitioner(s) and myself.  I personally evaluated the patient during the encounter  Callen Vancuren, MD 06/11/11 1626 

## 2011-06-11 NOTE — ED Provider Notes (Signed)
Complains of feeling of room moving, worse when she moves her eyes however continues to have similar symptoms when she closes her eyes she denies blurred vision denies headache denies tinnitus no nausea she treated herself with an Advil cold and sinus tablet with transient relief On exam awake alert Glasgow Coma Score 15 eyes pupils equal round react to light fundi benign ears normal no facial asymmetry neurologic exam Glasgow Coma Score 15 gait normal Romberg normal pronator drift normal. Suspect vertigo Will obtain MRI of brain due to atypical symptoms  Doug Sou, MD 06/11/11 1547

## 2011-06-11 NOTE — ED Notes (Signed)
Pt reporting on Monday noticed vision was "off", continued to Wed, developed brief nausea vision still "off", reporting vertigo, no vomiting. Symptoms persisted until today, pt reports when close eyes then open "the room jerks", if I look to the right, then the left, "the room follows". Pt appearing anxious. Denying any blurry vision. No dizziness at this time. Ambulated without difficulty. NAD

## 2011-06-11 NOTE — Discharge Instructions (Signed)
Your MRI was negative for brain abnormalities but did show a possible sinus infection on the right side. You have been given a prescription for an antibiotic to treat this. Please take this as prescribed. You may also try the meclizine for the dizziness symptoms. Follow up with your primary care doctor as needed. Return to the ED for worsening condition.  Dizziness Dizziness is a common problem. It is a feeling of unsteadiness or lightheadedness. You may feel like you are about to faint. Dizziness can lead to injury if you stumble or fall. A person of any age group can suffer from dizziness, but dizziness is more common in older adults. CAUSES  Dizziness can be caused by many different things, including:  Middle ear problems.   Standing for too long.   Infections.   An allergic reaction.   Aging.   An emotional response to something, such as the sight of blood.   Side effects of medicines.   Fatigue.   Problems with circulation or blood pressure.   Excess use of alcohol, medicines, or illegal drug use.   Breathing too fast (hyperventilation).   An arrhythmia or problems with your heart rhythm.   Low red blood cell count (anemia).   Pregnancy.   Vomiting, diarrhea, fever, or other illnesses that cause dehydration.   Diseases or conditions such as Parkinson's disease, high blood pressure (hypertension), diabetes, and thyroid problems.   Exposure to extreme heat.  DIAGNOSIS  To find the cause of your dizziness, your caregiver may do a physical exam, lab tests, radiologic imaging scans, or an electrocardiography test (ECG).  TREATMENT  Treatment of dizziness depends on the cause of your symptoms and can vary greatly. HOME CARE INSTRUCTIONS   Drink enough fluids to keep your urine clear or pale yellow. This is especially important in very hot weather. In the elderly, it is also important in cold weather.   If your dizziness is caused by medicines, take them exactly as  directed. When taking blood pressure medicines, it is especially important to get up slowly.   Rise slowly from chairs and steady yourself until you feel okay.   In the morning, first sit up on the side of the bed. When this seems okay, stand slowly while holding onto something until you know your balance is fine.   If you need to stand in one place for a long time, be sure to move your legs often. Tighten and relax the muscles in your legs while standing.   If dizziness continues to be a problem, have someone stay with you for a day or two. Do this until you feel you are well enough to stay alone. Have the person call your caregiver if he or she notices changes in you that are concerning.   Do not drive or use heavy machinery if you feel dizzy.  SEEK IMMEDIATE MEDICAL CARE IF:   Your dizziness or lightheadedness gets worse.   You feel nauseous or vomit.   You develop problems with talking, walking, weakness, or using your arms, hands, or legs.   You are not thinking clearly or you have difficulty forming sentences. It may take a friend or family member to determine if your thinking is normal.   You develop chest pain, abdominal pain, shortness of breath, or sweating.   Your vision changes.   You notice any bleeding.   You have side effects from medicine that seems to be getting worse rather than better.  MAKE SURE YOU:  Understand these instructions.   Will watch your condition.   Will get help right away if you are not doing well or get worse.  Document Released: 07/14/2000 Document Revised: 01/07/2011 Document Reviewed: 08/07/2010 [facility] Patient Information 2012 [place], [place].

## 2011-06-11 NOTE — ED Provider Notes (Signed)
4:57 PM Patient was moved to CDU awaiting MRI of her brain. She has had changes in her vision and a sensation of unsteadiness for about the past 5 days. Symptoms are somewhat atypical, so a MR of her brain was ordered. This is negative for acute findings, but does show some opacification of the right frontal sinus. Patient states that while she was in MRI, lying flat on her back, she noticed a sensation of sinus pressure and congestion, which he had not noticed before. Based on this, I think it is reasonable to treat her for possible sinusitis. She has an upcoming appointment with her PCP and was encouraged to keep this. Reasons to return to the ED were discussed. She and mom verbalized understanding and were agreeable to plan.  Results for orders placed during the hospital encounter of 06/11/11  URINALYSIS, ROUTINE W REFLEX MICROSCOPIC      Component Value Range   Color, Urine YELLOW  YELLOW    APPearance CLEAR  CLEAR    Specific Gravity, Urine 1.010  1.005 - 1.030    pH 7.5  5.0 - 8.0    Glucose, UA NEGATIVE  NEGATIVE (mg/dL)   Hgb urine dipstick NEGATIVE  NEGATIVE    Bilirubin Urine NEGATIVE  NEGATIVE    Ketones, ur NEGATIVE  NEGATIVE (mg/dL)   Protein, ur NEGATIVE  NEGATIVE (mg/dL)   Urobilinogen, UA 0.2  0.0 - 1.0 (mg/dL)   Nitrite NEGATIVE  NEGATIVE    Leukocytes, UA NEGATIVE  NEGATIVE   PREGNANCY, URINE      Component Value Range   Preg Test, Ur NEGATIVE  NEGATIVE   POCT I-STAT, CHEM 8      Component Value Range   Sodium 142  135 - 145 (mEq/L)   Potassium 3.8  3.5 - 5.1 (mEq/L)   Chloride 106  96 - 112 (mEq/L)   BUN 9  6 - 23 (mg/dL)   Creatinine, Ser 1.61  0.50 - 1.10 (mg/dL)   Glucose, Bld 096 (*) 70 - 99 (mg/dL)   Calcium, Ion 0.45  4.09 - 1.32 (mmol/L)   TCO2 26  0 - 100 (mmol/L)   Hemoglobin 15.0  12.0 - 15.0 (g/dL)   HCT 81.1  91.4 - 78.2 (%)   Mr Brain Wo Contrast  06/11/2011  *RADIOLOGY REPORT*  Clinical Data: Abnormal vision since Monday.  Dizziness.  MRI HEAD  WITHOUT CONTRAST  Technique:  Multiplanar, multiecho pulse sequences of the brain and surrounding structures were obtained according to standard protocol without intravenous contrast.  Comparison: None.  Findings: The diffusion weighted images demonstrate no evidence for acute or subacute infarction.  No significant hemorrhage or mass lesion is present.  Flow is present in the major intracranial arteries.  The globes and orbits are intact.  The right frontal sinus is opacified.  The paranasal sinuses and mastoid air cells are otherwise clear.  IMPRESSION:  1.  Normal MRI appearance of the brain. 2.  Opacification of the right frontal sinus.  No obstructing lesion is evident.  Original Report Authenticated By: Jamesetta Orleans. MATTERN, M.D.      Grant Fontana, [place] 06/11/11 1701

## 2011-06-11 NOTE — ED Notes (Signed)
Patient transported to MRI 

## 2011-06-11 NOTE — ED Provider Notes (Signed)
History     CSN: [id number]  Arrival date & time 06/11/11  1159   First MD Initiated Contact with Patient 06/11/11 1321      Chief Complaint  Patient presents with  . Blurred Vision    (Consider location/radiation/quality/duration/timing/severity/associated sxs/prior treatment) HPI Comments: Patient complaining of abnormal sensation of room moving around her and the "room dragging behind" as she moves her eyes around.  Associated nausea and tingling on top of her head.  Not eating or drinking well.  This began 5 days ago her vision was "strained" and her eyes felt tired, she then developed nausea and sensation of the room spinning two days ago.  She did not improve with benadryl but did improve temporarily with advil cold and sinus.  States she then began reading to her son and the movement of her eyes back and forth across the page started her symptoms again.   Family hx vertigo and meniere's disease.  Patient has no medical problems.  Denies fevers, ear pain, tinnitus, nasal congestion or sinus pressure, recent illness.    The history is provided by the patient and a relative.    Past Medical History  Diagnosis Date  . Seasonal allergies   . Cough 08/08/2010    Past Surgical History  Procedure Date  . Hemangioma excision   . Tonsillectomy     Family History  Problem Relation Age of Onset  . Uterine cancer Maternal Grandmother     History  Substance Use Topics  . Smoking status: Never Smoker   . Smokeless tobacco: Never Used  . Alcohol Use: No    OB History    Grav Para Term Preterm Abortions TAB SAB Ect Mult Living                  Review of Systems  Constitutional: Positive for chills. Negative for fever.  HENT: Negative for congestion, sore throat, neck pain and sinus pressure.   Respiratory: Positive for cough. Negative for shortness of breath.        Cough from seasonal allergies  Cardiovascular: Negative for chest pain.  Gastrointestinal: Negative for  vomiting, abdominal pain and diarrhea.  Genitourinary: Negative for dysuria, urgency, frequency, vaginal bleeding and vaginal discharge.       Endometrial ablation  Neurological: Positive for dizziness. Negative for weakness and numbness.    Allergies  Ativan and Zithromax  Home Medications   Current Outpatient Rx  Name Route Sig Dispense Refill  . THERA M PLUS PO TABS Oral Take 1 tablet by mouth daily.      BP 118/76  Pulse 75  Temp 99.3 F (37.4 C)  Resp 20  SpO2 99%  Physical Exam  Nursing note and vitals reviewed. Constitutional: She is oriented to person, place, and time. She appears well-developed and well-nourished.  HENT:  Head: Normocephalic and atraumatic.  Right Ear: Tympanic membrane and ear canal normal.  Left Ear: Tympanic membrane and ear canal normal.  Mouth/Throat: Oropharynx is clear and moist. No oropharyngeal exudate.  Neck: Neck supple.  Cardiovascular: Normal rate, regular rhythm and normal heart sounds.   Pulmonary/Chest: Breath sounds normal. No respiratory distress. She has no wheezes. She has no rales. She exhibits no tenderness.  Abdominal: Soft. Bowel sounds are normal. She exhibits no distension. There is no tenderness. There is no rebound and no guarding.  Neurological: She is alert and oriented to person, place, and time. She has normal strength. No cranial nerve deficit or sensory deficit. She exhibits normal  muscle tone. She displays a negative Romberg sign. Coordination and gait normal. GCS eye subscore is 4. GCS verbal subscore is 5. GCS motor subscore is 6.       CN II-XII intact, EOMs intact, no pronator drift, grip strengths equal bilaterally; finger to nose, heel to shin, rapid alternating movements are normal; strength 5/5 in all extremities, sensation is intact, gait is normal.       ED Course  Procedures (including critical care time)  Labs Reviewed  POCT I-STAT, CHEM 8 - Abnormal; Notable for the following:    Glucose, Bld 112  (*)    All other components within normal limits  URINALYSIS, ROUTINE W REFLEX MICROSCOPIC  PREGNANCY, URINE   No results found.  Pt declines any medications for her symptoms.    1:59 PM Dr Ethelda Chick to also see the patient.    No diagnosis found.    MDM  Patient with symptoms similar to vertigo, intermittent x 5 days.  Reported as field of vision tracking behind her eye movement, spinning with closing eyes.  Pt to move to CDU for MRI brain.  If negative, pt d/c home with diagnosis of vertigo, meclizine for symptoms.  Discussed patient with Grant Fontana, PA-C, who assumes care of patient at change of shift.           Rise Patience, [place] 06/11/11 1558

## 2011-06-13 NOTE — ED Provider Notes (Signed)
Medical screening examination/treatment/procedure(s) were conducted as a shared visit with non-physician practitioner(s) and myself.  I personally evaluated the patient during the encounter   Dione Booze, MD 06/13/11 [phone]

## 2011-06-14 ENCOUNTER — Telehealth: Payer: Self-pay | Admitting: Internal Medicine

## 2011-06-14 NOTE — Telephone Encounter (Signed)
Called and spoke with patient and she now has [employer]. Copy of card has been scanned into Epic on 06/11/11. Verified ID # and Group number with patient and this information is correct. Advised patient that we would check on pre cert under this plan. Information given to [facility] who scheduled CT.

## 2011-06-14 NOTE — Telephone Encounter (Signed)
Pre auth sumitted to [name] pt is aware [name]

## 2011-06-15 ENCOUNTER — Other Ambulatory Visit: Payer: PRIVATE HEALTH INSURANCE

## 2011-06-16 ENCOUNTER — Other Ambulatory Visit: Payer: PRIVATE HEALTH INSURANCE

## 2011-06-18 ENCOUNTER — Ambulatory Visit
Admission: RE | Admit: 2011-06-18 | Discharge: 2011-06-18 | Disposition: A | Discharge status: Home / Self Care | Payer: Managed Care, Other (non HMO) | Source: Ambulatory Visit | Attending: Internal Medicine | Admitting: Internal Medicine

## 2011-06-18 ENCOUNTER — Encounter: Payer: Self-pay | Admitting: Internal Medicine

## 2011-06-18 ENCOUNTER — Other Ambulatory Visit: Payer: PRIVATE HEALTH INSURANCE

## 2011-06-18 DIAGNOSIS — R911 Solitary pulmonary nodule: Secondary | ICD-10-CM

## 2011-06-21 NOTE — Progress Notes (Signed)
Quick Note:  Spoke with pt and notified of results per Dr. Wert. Pt verbalized understanding and denied any questions.  ______ 

## 2011-06-24 ENCOUNTER — Telehealth: Payer: Self-pay | Admitting: Internal Medicine

## 2011-06-24 NOTE — Telephone Encounter (Signed)
Results faxed to [facility] at [phone] Carron Curie, CMA

## 2011-06-24 NOTE — Telephone Encounter (Signed)
Notes Recorded by Nyoka Cowden, MD on 06/18/2011 at 9:28 PM Call patient : Study is unremarkable, Nodule is sltly small, no further xrays recommended (ever) for this problem   lmomtcb x1

## 2011-07-15 ENCOUNTER — Encounter: Payer: PRIVATE HEALTH INSURANCE | Admitting: Internal Medicine

## 2011-07-15 NOTE — Progress Notes (Signed)
 This encounter was created in error - please disregard.

## 2014-07-23 ENCOUNTER — Other Ambulatory Visit: Payer: Self-pay | Admitting: Obstetrics and Gynecology

## 2014-07-24 LAB — CYTOLOGY - PAP

## 2016-02-11 DIAGNOSIS — L821 Other seborrheic keratosis: Secondary | ICD-10-CM | POA: Diagnosis not present

## 2016-04-20 DIAGNOSIS — Z1231 Encounter for screening mammogram for malignant neoplasm of breast: Secondary | ICD-10-CM | POA: Diagnosis not present

## 2016-04-20 DIAGNOSIS — Z01419 Encounter for gynecological examination (general) (routine) without abnormal findings: Secondary | ICD-10-CM | POA: Diagnosis not present

## 2016-04-20 DIAGNOSIS — Z6827 Body mass index (BMI) 27.0-27.9, adult: Secondary | ICD-10-CM | POA: Diagnosis not present

## 2016-05-13 DIAGNOSIS — Z131 Encounter for screening for diabetes mellitus: Secondary | ICD-10-CM | POA: Diagnosis not present

## 2016-05-13 DIAGNOSIS — Z13220 Encounter for screening for lipoid disorders: Secondary | ICD-10-CM | POA: Diagnosis not present

## 2016-05-13 DIAGNOSIS — Z1329 Encounter for screening for other suspected endocrine disorder: Secondary | ICD-10-CM | POA: Diagnosis not present

## 2016-05-13 DIAGNOSIS — Z130 Encounter for screening for diseases of the blood and blood-forming organs and certain disorders involving the immune mechanism: Secondary | ICD-10-CM | POA: Diagnosis not present

## 2016-05-24 ENCOUNTER — Telehealth: Payer: Self-pay | Admitting: Genetics

## 2016-05-24 ENCOUNTER — Encounter: Payer: Self-pay | Admitting: Genetics

## 2016-05-24 NOTE — Telephone Encounter (Signed)
Received a call from Santiago Glad at [facility] to schedule the pt a genetic counseling appt. Appt has been scheduled for the pt to see Vicente Males on 5/10 at 1pm. Will mail the pt a letter.

## 2016-06-10 ENCOUNTER — Other Ambulatory Visit: Payer: Managed Care, Other (non HMO)

## 2016-06-10 ENCOUNTER — Ambulatory Visit: Payer: Managed Care, Other (non HMO) | Admitting: Genetics

## 2016-07-01 ENCOUNTER — Other Ambulatory Visit: Payer: BLUE CROSS/BLUE SHIELD

## 2016-07-01 ENCOUNTER — Ambulatory Visit: Payer: BLUE CROSS/BLUE SHIELD | Admitting: Genetics

## 2016-07-01 DIAGNOSIS — Z8049 Family history of malignant neoplasm of other genital organs: Secondary | ICD-10-CM

## 2016-07-09 ENCOUNTER — Telehealth: Payer: Self-pay | Admitting: Genetics

## 2016-07-09 ENCOUNTER — Encounter: Payer: Self-pay | Admitting: Genetics

## 2016-07-09 ENCOUNTER — Ambulatory Visit: Payer: Self-pay | Admitting: Genetics

## 2016-07-09 DIAGNOSIS — Z1379 Encounter for other screening for genetic and chromosomal anomalies: Secondary | ICD-10-CM

## 2016-07-09 HISTORY — DX: Encounter for other screening for genetic and chromosomal anomalies: Z13.79

## 2016-07-09 NOTE — Telephone Encounter (Signed)
Reviewed that germline genetic testing revealed no pathogenic mutations. This is considered to be a negative result. Testing was performed through a custom panel that combined [facility]'s 46-gene Common Hereditary Cancers Panel + NF2. This panel included analysis of the following 47 genes: APC, ATM, AXIN2, BARD1, BMPR1A, BRCA1, BRCA2, BRIP1, CDH1, CDKN2A, CHEK2, CTNNA1, DICER1, EPCAM, GREM1, HOXB13, KIT, MEN1, MLH1, MSH2, MSH3, MSH6, MUTYH, NBN, NF1, NF2, NTHL1, PALB2, PDGFRA, PMS2, POLD1, POLE, PTEN, RAD50, RAD51C, RAD51D, SDHA, SDHB, SDHC, SDHD, SMAD4, SMARCA4, STK11, TP53, TSC1, TSC2, and VHL.  For more detailed discussion, please see genetic counseling documentation from 07/09/2016. Result report dated 07/07/2016.

## 2016-07-09 NOTE — Progress Notes (Signed)
HPI: Ms. Gladd was previously seen in the [facility] on 07/01/2016 due to a family history of cancer and concerns regarding a hereditary predisposition to cancer. Please refer to our prior cancer genetics clinic note for more information regarding Ms. Mcdonald's medical, social and family histories, and our assessment and recommendations, at the time. Ms. Morace recent genetic test results were disclosed to her, as were recommendations warranted by these results. These results and recommendations are discussed in more detail below.  CANCER HISTORY:   No history exists.    FAMILY HISTORY:  We obtained a detailed, 4-generation family history.  Significant diagnoses are listed below: Family History  Problem Relation Age of Onset  . Uterine cancer Maternal Grandmother 51       d.93  . Lung cancer Maternal Uncle 60       d.60s history of smoking  . Lung cancer Paternal Grandmother   . Brain cancer Son 1       d.6 from ependymoma   Ms. Waldrip has three sons. Her oldest son died at age 32 from an intracranial ependymoma diagnosed at age 50. Her youngest son, age 86, has cerebral palsy perhaps related to fetal hydrops and early-delivery at 31 weeks. Ms. Sinning middle son, age 47, is reportedly healthy. Ms. Lacomb has two brothers (twins to each other), age 105 without cancers.  Ms. Diefendorf mother is 89 without cancer. Ms. Fambrough has two maternal aunts and three maternal uncles. Her aunts are in their 53s and 8s without cancers. One uncle died in his 74s with lung cancer and had a history of smoking. The other two uncles are in their late-60s without cancers. Ms. Tenaglia maternal grandmother had uterine cancer around age 53 and died at 83. At her initial appointment, Ms. Vaeth reported that this grandmother had two sisters who died of breast cancer. At the time of genetic testing results disclosure, Ms. Herskowitz reported that her grandmother actually had three sisters who died  of breast cancer. Ms. Pauli maternal grandfather drowned at age 74. He did not have a history of cancer.  Ms. Gills father died at 13 without cancers. Ms. Noack reports that her father was the only child shared by his parents, though he did have a maternal half-sister. Ms. Brisbin paternal grandmother died with lung cancer. Her paternal grandfather died in his 26s without cancers. Further information about Ms. Elmes's paternal family history is limited as her father was raised by his paternal grandmother.   Ms. Wanat is unaware of previous family history of genetic testing for hereditary cancer risks. Patient's maternal ancestors are of [place] descent, and paternal ancestors are of Caucasian descent. There is no reported Ashkenazi Jewish ancestry. There is no known consanguinity.  GENETIC TEST RESULTS: Genetic testing performed through a custom panel combining [facility]'s Common Hereditary Cancer Panel + NF2, reported out on 07/07/2016, showed no pathogenic mutations. This custom panel included analysis of the following 47 genes: APC, ATM, AXIN2, BARD1, BMPR1A, BRCA1, BRCA2, BRIP1, CDH1, CDKN2A, CHEK2, CTNNA1, DICER1, EPCAM, GREM1, HOXB13, KIT, MEN1, MLH1, MSH2, MSH3, MSH6, MUTYH, NBN, NF1, NF2, NTHL1, PALB2, PDGFRA, PMS2, POLD1, POLE, PTEN, RAD50, RAD51C, RAD51D, SDHA, SDHB, SDHC, SDHD, SMAD4, SMARCA4, STK11, TP53, TSC1, TSC2, and VHL.  The test report will be scanned into EPIC and will be located under the Molecular Pathology section of the Results Review tab.A portion of the result report is included below for reference.    We discussed with Ms. Pair that since the current genetic  testing is not perfect, it is possible there may be a gene mutation in one of these genes that current testing cannot detect, but that chance is small. We also discussed, that it is possible that another gene that has not yet been discovered, or that we have not yet tested, is responsible for the cancer  diagnoses in the family. Therefore, important to remain in touch with cancer genetics in the future so that we can continue to offer Ms. Lagasse the most up to date genetic testing.   CANCER SCREENING RECOMMENDATIONS: This normal result is reassuring and indicates that it is unlikely Ms. Lutes has an increased risk of cancer due to a mutation in one of these genes. Therefore, Ms. Hole was advised to continue following the cancer screening guidelines provided by her primary healthcare providers.   RECOMMENDATIONS FOR FAMILY MEMBERS: Based on Ms. Goodenow's family history, her mother and maternal aunts and uncles also appear to be candidates for genetic testing. These family members are encouraged to consult their physicians regarding their genetic counseling and testing options. Ms. Doukas will let us know if we can be of any assistance in coordinating genetic counseling and/or testing for these family members.   FOLLOW-UP: Lastly, we discussed with Ms. Pomplun that cancer genetics is a rapidly advancing field and it is possible that new genetic tests will be appropriate for her and/or her family members in the future. We encouraged her to remain in contact with cancer genetics on an annual basis so we can update her personal and family histories and let her know of advances in cancer genetics that may benefit this family.   Our contact number was provided. Ms. Cino questions were answered to her satisfaction, and she knows she is welcome to call us at anytime with additional questions or concerns.   Mal Misty, MS, [facility] Certified Naval [email or web address]

## 2016-07-09 NOTE — Progress Notes (Unsigned)
REFERRING PROVIDER: Arvella Nigh, MD [address] [place], [place] [address]  PRIMARY PROVIDER:  Antony Contras, MD  PRIMARY REASON FOR VISIT:  No diagnosis found.   HISTORY OF PRESENT ILLNESS:   Brittany [name], a 41 y.o. female, was seen for a [facility] cancer genetics consultation at the request of Brittany [name] due to a family history of cancer.  Brittany [name] presents to clinic today to discuss the possibility of a hereditary predisposition to cancer, genetic testing, and to further clarify her future cancer risks, as well as potential cancer risks for family members.    Brittany [name] is a 41 y.o. female with no personal history of cancer.    CANCER HISTORY:   No history exists.    Past Medical History:  Diagnosis Date  . Cough 08/08/2010  . Seasonal allergies     Past Surgical History:  Procedure Laterality Date  . HEMANGIOMA EXCISION    . TONSILLECTOMY      Social History   Social History  . Marital status: Married    Spouse name: N/A  . Number of children: 2  . Years of education: N/A   Social History Main Topics  . Smoking status: Never Smoker  . Smokeless tobacco: Never Used  . Alcohol use No  . Drug use: No  . Sexual activity: Not on file   Other Topics Concern  . Not on file   Social History Narrative  . No narrative on file     FAMILY HISTORY:  We obtained a detailed, 4-generation family history.  Significant diagnoses are listed below: Family History  Problem Relation Age of Onset  . Uterine cancer Maternal Grandmother 31       d.93  . Lung cancer Maternal Uncle 60       d.60s history of smoking  . Lung cancer Paternal Grandmother   . 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 Her oldest [name] died at age 41 from an intracranial ependymoma diagnosed at age 53. Her youngest [name], age 61, has cerebral palsy perhaps related to fetal hydrops and early-delivery at 31 weeks. Brittany [name], age 3, is reportedly  healthy. Brittany [name] has two brothers (twins to each other), age 29 without cancers.  Brittany [name]. Brittany [name] mother is 54 without cancer. Brittany [name]. Brittany [name] has two maternal aunts and three maternal uncles. Her aunts are in their 31s and 67s without cancers. One uncle died in his 12s with lung cancer and had a history of smoking. The other two uncles are in their late-60s without cancers. Brittany [name]. Brittany [name] maternal grandmother had uterine cancer around age 16 and died at 18. At her initial appointment, Brittany [name] reported that this grandmother had two sisters who died of breast cancer. At the time of genetic testing results disclosure, Brittany [name] reported that her grandmother actually had three sisters who died of breast cancer. Brittany [name] maternal grandfather drowned at age 22. He did not have a history of cancer.  Brittany [name] father died at 4 without cancers. Brittany [name] reports that her father was the only child shared by his parents, though he did have a maternal half-sister. Brittany [name] paternal grandmother died with lung cancer. Her paternal grandfather died in his 50s without cancers. Further information about Brittany [name]'s paternal family history is limited as her father was raised by his paternal grandmother.   Brittany [name] is unaware of previous family history of  genetic testing for hereditary cancer risks. Patient's maternal ancestors are of [place] descent, and paternal ancestors are of Caucasian descent. There is no reported Ashkenazi Jewish ancestry. There is no known consanguinity.  GENETIC COUNSELING ASSESSMENT: Brittany [name] is a 41 y.o. female with a family history which is somewhat suggestive of a hereditary cancer syndrome and predisposition to cancer. We, therefore, discussed and recommended the following at today's visit.   DISCUSSION: We reviewed the characteristics, features and inheritance patterns of hereditary cancer syndromes. We also discussed genetic testing, including the appropriate family  members to test, the process of testing, insurance coverage and turn-around-time for results. We discussed the implications of a negative, positive and/or variant of uncertain significant result. We recommended Brittany [name] pursue genetic testing for the Common Hereditary Cancers Panel + NF2 offered by [facility]. This custom panel includes analysis of the following 47 genes: APC, ATM, AXIN2, BARD1, BMPR1A, BRCA1, BRCA2, BRIP1, CDH1, CDKN2A, CHEK2, CTNNA1, DICER1, EPCAM, GREM1, HOXB13, KIT, MEN1, MLH1, MSH2, MSH3, MSH6, MUTYH, NBN, NF1, NF2, NTHL1, PALB2, PDGFRA, PMS2, POLD1, POLE, PTEN, RAD50, RAD51C, RAD51D, SDHA, SDHB, SDHC, SDHD, SMAD4, SMARCA4, STK11, TP53, TSC1, TSC2, and VHL.  Based on Brittany [name] family history of cancer, she meets medical criteria for genetic testing. Despite that she meets criteria, she may still have an out of pocket cost. We discussed that if her out of pocket cost for testing is over $100, the laboratory will call and confirm whether she wants to proceed with testing.  If the out of pocket cost of testing is less than $100 she will be billed by the genetic testing laboratory.   PLAN: After considering the risks, benefits, and limitations, Brittany [name]  provided informed consent to pursue genetic testing and the blood sample was sent to [facility] for analysis of the 46-gene Common Hereditary Cancers Panel + NF2. Results should be available within approximately 3 weeks' time, at which point they will be disclosed by telephone to Brittany [name], as will any additional recommendations warranted by these results. This information will also be available in Epic.   Based on Brittany [name]. Brittany [name] family history, her mother and maternal aunts and uncles also appear to be candidates for genetic testing. These family members are encouraged to consult their physicians regarding their genetic counseling and testing options. Brittany [name]. Brittany [name] will let us know if we can be of any assistance in coordinating  genetic counseling and/or testing for these family members.   Lastly, we encouraged Brittany [name]. Brittany [name] to remain in contact with cancer genetics annually so that we can continuously update the family history and inform her of any changes in cancer genetics and testing that may be of benefit for this family.   Brittany [name].  Abadi questions were answered to her satisfaction today. Our contact information was provided should additional questions or concerns arise. Thank you for the referral and allowing [place] to share in the care of your patient.   Mal Misty, Brittany [name], [facility] Certified Naval [email or web address] phone: [phone]  The patient was seen for a total of 30 minutes in face-to-face genetic counseling.  This patient was discussed with Drs. Magrinat, Lindi Adie and/or Burr Medico who agrees with the above.    _______________________________________________________________________ For Office Staff:  Number of people involved in session: 1 Was an Intern/ student involved with case: no

## 2017-04-07 DIAGNOSIS — R05 Cough: Secondary | ICD-10-CM | POA: Diagnosis not present

## 2017-04-25 DIAGNOSIS — N390 Urinary tract infection, site not specified: Secondary | ICD-10-CM | POA: Diagnosis not present

## 2017-04-25 DIAGNOSIS — K649 Unspecified hemorrhoids: Secondary | ICD-10-CM | POA: Diagnosis not present

## 2017-04-25 DIAGNOSIS — N898 Other specified noninflammatory disorders of vagina: Secondary | ICD-10-CM | POA: Diagnosis not present

## 2017-05-05 DIAGNOSIS — R002 Palpitations: Secondary | ICD-10-CM | POA: Diagnosis not present

## 2017-08-01 DIAGNOSIS — Z6828 Body mass index (BMI) 28.0-28.9, adult: Secondary | ICD-10-CM | POA: Diagnosis not present

## 2017-08-01 DIAGNOSIS — Z01419 Encounter for gynecological examination (general) (routine) without abnormal findings: Secondary | ICD-10-CM | POA: Diagnosis not present

## 2017-08-01 DIAGNOSIS — Z1231 Encounter for screening mammogram for malignant neoplasm of breast: Secondary | ICD-10-CM | POA: Diagnosis not present

## 2018-01-11 DIAGNOSIS — X32XXXA Exposure to sunlight, initial encounter: Secondary | ICD-10-CM | POA: Diagnosis not present

## 2018-01-11 DIAGNOSIS — L570 Actinic keratosis: Secondary | ICD-10-CM | POA: Diagnosis not present

## 2018-02-23 DIAGNOSIS — J321 Chronic frontal sinusitis: Secondary | ICD-10-CM | POA: Diagnosis not present

## 2018-02-23 DIAGNOSIS — J40 Bronchitis, not specified as acute or chronic: Secondary | ICD-10-CM | POA: Diagnosis not present

## 2018-11-30 DIAGNOSIS — J3489 Other specified disorders of nose and nasal sinuses: Secondary | ICD-10-CM | POA: Diagnosis not present

## 2018-11-30 DIAGNOSIS — J0140 Acute pansinusitis, unspecified: Secondary | ICD-10-CM | POA: Diagnosis not present

## 2018-12-12 DIAGNOSIS — G43009 Migraine without aura, not intractable, without status migrainosus: Secondary | ICD-10-CM | POA: Diagnosis not present

## 2018-12-25 DIAGNOSIS — G43009 Migraine without aura, not intractable, without status migrainosus: Secondary | ICD-10-CM | POA: Diagnosis not present

## 2019-05-16 DIAGNOSIS — G43009 Migraine without aura, not intractable, without status migrainosus: Secondary | ICD-10-CM | POA: Diagnosis not present

## 2019-07-31 DIAGNOSIS — Z1231 Encounter for screening mammogram for malignant neoplasm of breast: Secondary | ICD-10-CM | POA: Diagnosis not present

## 2019-09-04 DIAGNOSIS — G43009 Migraine without aura, not intractable, without status migrainosus: Secondary | ICD-10-CM | POA: Diagnosis not present

## 2019-09-04 DIAGNOSIS — R5383 Other fatigue: Secondary | ICD-10-CM | POA: Diagnosis not present

## 2019-09-04 DIAGNOSIS — E349 Endocrine disorder, unspecified: Secondary | ICD-10-CM | POA: Diagnosis not present

## 2019-09-04 DIAGNOSIS — E663 Overweight: Secondary | ICD-10-CM | POA: Diagnosis not present

## 2019-09-19 DIAGNOSIS — Z6827 Body mass index (BMI) 27.0-27.9, adult: Secondary | ICD-10-CM | POA: Diagnosis not present

## 2019-09-19 DIAGNOSIS — Z01419 Encounter for gynecological examination (general) (routine) without abnormal findings: Secondary | ICD-10-CM | POA: Diagnosis not present

## 2019-09-19 DIAGNOSIS — N760 Acute vaginitis: Secondary | ICD-10-CM | POA: Diagnosis not present

## 2019-09-25 DIAGNOSIS — E559 Vitamin D deficiency, unspecified: Secondary | ICD-10-CM | POA: Diagnosis not present

## 2019-09-25 DIAGNOSIS — Z9889 Other specified postprocedural states: Secondary | ICD-10-CM | POA: Diagnosis not present

## 2019-09-25 DIAGNOSIS — G43009 Migraine without aura, not intractable, without status migrainosus: Secondary | ICD-10-CM | POA: Diagnosis not present

## 2019-09-25 DIAGNOSIS — R5383 Other fatigue: Secondary | ICD-10-CM | POA: Diagnosis not present

## 2019-09-25 DIAGNOSIS — E538 Deficiency of other specified B group vitamins: Secondary | ICD-10-CM | POA: Diagnosis not present

## 2019-09-25 DIAGNOSIS — E663 Overweight: Secondary | ICD-10-CM | POA: Diagnosis not present

## 2019-09-25 DIAGNOSIS — Z13220 Encounter for screening for lipoid disorders: Secondary | ICD-10-CM | POA: Diagnosis not present

## 2019-09-25 DIAGNOSIS — E349 Endocrine disorder, unspecified: Secondary | ICD-10-CM | POA: Diagnosis not present

## 2019-09-26 DIAGNOSIS — I781 Nevus, non-neoplastic: Secondary | ICD-10-CM | POA: Diagnosis not present

## 2019-09-26 DIAGNOSIS — D225 Melanocytic nevi of trunk: Secondary | ICD-10-CM | POA: Diagnosis not present

## 2019-09-26 DIAGNOSIS — Z1283 Encounter for screening for malignant neoplasm of skin: Secondary | ICD-10-CM | POA: Diagnosis not present

## 2019-09-26 DIAGNOSIS — B078 Other viral warts: Secondary | ICD-10-CM | POA: Diagnosis not present

## 2019-10-18 DIAGNOSIS — G43009 Migraine without aura, not intractable, without status migrainosus: Secondary | ICD-10-CM | POA: Diagnosis not present

## 2019-10-18 DIAGNOSIS — R5383 Other fatigue: Secondary | ICD-10-CM | POA: Diagnosis not present

## 2019-10-18 DIAGNOSIS — E663 Overweight: Secondary | ICD-10-CM | POA: Diagnosis not present

## 2019-10-18 DIAGNOSIS — E349 Endocrine disorder, unspecified: Secondary | ICD-10-CM | POA: Diagnosis not present

## 2019-11-01 DIAGNOSIS — H00022 Hordeolum internum right lower eyelid: Secondary | ICD-10-CM | POA: Diagnosis not present

## 2019-11-14 DIAGNOSIS — G43009 Migraine without aura, not intractable, without status migrainosus: Secondary | ICD-10-CM | POA: Diagnosis not present

## 2020-01-30 DIAGNOSIS — Z9889 Other specified postprocedural states: Secondary | ICD-10-CM | POA: Diagnosis not present

## 2020-01-30 DIAGNOSIS — R5383 Other fatigue: Secondary | ICD-10-CM | POA: Diagnosis not present

## 2020-01-30 DIAGNOSIS — E663 Overweight: Secondary | ICD-10-CM | POA: Diagnosis not present

## 2020-01-30 DIAGNOSIS — E349 Endocrine disorder, unspecified: Secondary | ICD-10-CM | POA: Diagnosis not present

## 2020-01-30 DIAGNOSIS — R790 Abnormal level of blood mineral: Secondary | ICD-10-CM | POA: Diagnosis not present

## 2020-01-30 DIAGNOSIS — G43009 Migraine without aura, not intractable, without status migrainosus: Secondary | ICD-10-CM | POA: Diagnosis not present

## 2020-02-21 DIAGNOSIS — R790 Abnormal level of blood mineral: Secondary | ICD-10-CM | POA: Diagnosis not present

## 2020-02-21 DIAGNOSIS — E785 Hyperlipidemia, unspecified: Secondary | ICD-10-CM | POA: Diagnosis not present

## 2020-02-21 DIAGNOSIS — G43009 Migraine without aura, not intractable, without status migrainosus: Secondary | ICD-10-CM | POA: Diagnosis not present

## 2020-02-21 DIAGNOSIS — E349 Endocrine disorder, unspecified: Secondary | ICD-10-CM | POA: Diagnosis not present

## 2020-02-25 DIAGNOSIS — E611 Iron deficiency: Secondary | ICD-10-CM | POA: Diagnosis not present

## 2020-03-06 DIAGNOSIS — E611 Iron deficiency: Secondary | ICD-10-CM | POA: Diagnosis not present

## 2020-03-13 DIAGNOSIS — E611 Iron deficiency: Secondary | ICD-10-CM | POA: Diagnosis not present

## 2020-03-20 DIAGNOSIS — E611 Iron deficiency: Secondary | ICD-10-CM | POA: Diagnosis not present

## 2020-03-20 DIAGNOSIS — R790 Abnormal level of blood mineral: Secondary | ICD-10-CM | POA: Diagnosis not present

## 2020-05-16 DIAGNOSIS — E559 Vitamin D deficiency, unspecified: Secondary | ICD-10-CM | POA: Diagnosis not present

## 2020-05-16 DIAGNOSIS — R790 Abnormal level of blood mineral: Secondary | ICD-10-CM | POA: Diagnosis not present

## 2020-05-16 DIAGNOSIS — G43009 Migraine without aura, not intractable, without status migrainosus: Secondary | ICD-10-CM | POA: Diagnosis not present

## 2020-05-16 DIAGNOSIS — E349 Endocrine disorder, unspecified: Secondary | ICD-10-CM | POA: Diagnosis not present

## 2020-05-16 DIAGNOSIS — E538 Deficiency of other specified B group vitamins: Secondary | ICD-10-CM | POA: Diagnosis not present

## 2020-05-16 DIAGNOSIS — R5383 Other fatigue: Secondary | ICD-10-CM | POA: Diagnosis not present

## 2020-05-16 DIAGNOSIS — E663 Overweight: Secondary | ICD-10-CM | POA: Diagnosis not present

## 2020-05-16 DIAGNOSIS — E611 Iron deficiency: Secondary | ICD-10-CM | POA: Diagnosis not present

## 2020-05-16 DIAGNOSIS — R7989 Other specified abnormal findings of blood chemistry: Secondary | ICD-10-CM | POA: Diagnosis not present

## 2020-05-16 DIAGNOSIS — E785 Hyperlipidemia, unspecified: Secondary | ICD-10-CM | POA: Diagnosis not present

## 2020-05-16 DIAGNOSIS — Z9889 Other specified postprocedural states: Secondary | ICD-10-CM | POA: Diagnosis not present

## 2020-05-16 DIAGNOSIS — R7303 Prediabetes: Secondary | ICD-10-CM | POA: Diagnosis not present

## 2020-05-28 DIAGNOSIS — Z0000 Encounter for general adult medical examination without abnormal findings: Secondary | ICD-10-CM | POA: Diagnosis not present

## 2020-06-03 DIAGNOSIS — E611 Iron deficiency: Secondary | ICD-10-CM | POA: Diagnosis not present

## 2020-06-03 DIAGNOSIS — R5383 Other fatigue: Secondary | ICD-10-CM | POA: Diagnosis not present

## 2020-06-03 DIAGNOSIS — E039 Hypothyroidism, unspecified: Secondary | ICD-10-CM | POA: Diagnosis not present

## 2020-06-03 DIAGNOSIS — E785 Hyperlipidemia, unspecified: Secondary | ICD-10-CM | POA: Diagnosis not present

## 2020-06-13 DIAGNOSIS — G43009 Migraine without aura, not intractable, without status migrainosus: Secondary | ICD-10-CM | POA: Diagnosis not present

## 2020-07-09 ENCOUNTER — Encounter: Payer: Self-pay | Admitting: Gastroenterology

## 2020-08-25 ENCOUNTER — Other Ambulatory Visit: Payer: Self-pay

## 2020-08-25 ENCOUNTER — Ambulatory Visit (AMBULATORY_SURGERY_CENTER): Payer: Self-pay | Admitting: *Deleted

## 2020-08-25 VITALS — Ht 65.0 in | Wt 167.0 lb

## 2020-08-25 DIAGNOSIS — Z1211 Encounter for screening for malignant neoplasm of colon: Secondary | ICD-10-CM

## 2020-08-25 NOTE — Progress Notes (Signed)
Patient is here in-person for PV. Patient denies any allergies to eggs or soy. Patient denies any problems with anesthesia/sedation. Patient denies any oxygen use at home. Patient denies taking any diet/weight loss medications or blood thinners. Patient is aware of our care-partner policy and 0000000 safety protocol. EMMI education assigned to the patient for the procedure, sent to [facility].   Marland Kitchen

## 2020-09-08 ENCOUNTER — Encounter: Payer: Self-pay | Admitting: Gastroenterology

## 2020-09-08 ENCOUNTER — Ambulatory Visit (AMBULATORY_SURGERY_CENTER): Payer: BC Managed Care – PPO | Admitting: Gastroenterology

## 2020-09-08 ENCOUNTER — Other Ambulatory Visit: Payer: Self-pay

## 2020-09-08 VITALS — BP 107/65 | HR 67 | Temp 98.6°F | Resp 15 | Ht 65.0 in | Wt 167.0 lb

## 2020-09-08 DIAGNOSIS — Z800 Family history of malignant neoplasm of digestive organs: Secondary | ICD-10-CM

## 2020-09-08 DIAGNOSIS — Z1211 Encounter for screening for malignant neoplasm of colon: Secondary | ICD-10-CM | POA: Diagnosis not present

## 2020-09-08 DIAGNOSIS — K635 Polyp of colon: Secondary | ICD-10-CM | POA: Diagnosis not present

## 2020-09-08 DIAGNOSIS — D123 Benign neoplasm of transverse colon: Secondary | ICD-10-CM

## 2020-09-08 MED ORDER — SODIUM CHLORIDE 0.9 % IV SOLN: 500.0000 mL | Freq: Once | INTRAVENOUS | Status: AC

## 2020-09-08 NOTE — Progress Notes (Signed)
Pt's states no medical or surgical changes since previsit or office visit. Vs by CW.

## 2020-09-08 NOTE — Progress Notes (Signed)
Called to room to assist during endoscopic procedure.  Patient ID and intended procedure confirmed with present staff. Received instructions for my participation in the procedure from the performing physician.  

## 2020-09-08 NOTE — Patient Instructions (Signed)
Information on polyps, diverticulosis and hemorrhoids given to you today.  Await pathology results.  Resume previous high fiber diet.  Continue present medications.  Repeat colonoscopy in 5 years due to family history of colonic polyps.  YOU HAD AN ENDOSCOPIC PROCEDURE TODAY AT [facility]:   Refer to the procedure report that was given to you for any specific questions about what was found during the examination.  If the procedure report does not answer your questions, please call your gastroenterologist to clarify.  If you requested that your care partner not be given the details of your procedure findings, then the procedure report has been included in a sealed envelope for you to review at your convenience later.  YOU SHOULD EXPECT: Some feelings of bloating in the abdomen. Passage of more gas than usual.  Walking can help get rid of the air that was put into your GI tract during the procedure and reduce the bloating. If you had a lower endoscopy (such as a colonoscopy or flexible sigmoidoscopy) you may notice spotting of blood in your stool or on the toilet paper. If you underwent a bowel prep for your procedure, you may not have a normal bowel movement for a few days.  Please Note:  You might notice some irritation and congestion in your nose or some drainage.  This is from the oxygen used during your procedure.  There is no need for concern and it should clear up in a day or so.  SYMPTOMS TO REPORT IMMEDIATELY:  Following lower endoscopy (colonoscopy or flexible sigmoidoscopy):  Excessive amounts of blood in the stool  Significant tenderness or worsening of abdominal pains  Swelling of the abdomen that is new, acute  Fever of 100F or higher  For urgent or emergent issues, a gastroenterologist can be reached at any hour by calling [phone]. Do not use MyChart messaging for urgent concerns.    DIET:  We do recommend a small meal at first, but then you may  proceed to your regular diet.  Drink plenty of fluids but you should avoid alcoholic beverages for 24 hours.  ACTIVITY:  You should plan to take it easy for the rest of today and you should NOT DRIVE or use heavy machinery until tomorrow (because of the sedation medicines used during the test).    FOLLOW UP: Our staff will call the number listed on your records 48-72 hours following your procedure to check on you and address any questions or concerns that you may have regarding the information given to you following your procedure. If we do not reach you, we will leave a message.  We will attempt to reach you two times.  During this call, we will ask if you have developed any symptoms of COVID 19. If you develop any symptoms (ie: fever, flu-like symptoms, shortness of breath, cough etc.) before then, please call [phone].  If you test positive for Covid 19 in the 2 weeks post procedure, please call and report this information to [place].    If any biopsies were taken you will be contacted by phone or by letter within the next 1-3 weeks.  Please call us at [phone] if you have not heard about the biopsies in 3 weeks.    SIGNATURES/CONFIDENTIALITY: You and/or your care partner have signed paperwork which will be entered into your electronic medical record.  These signatures attest to the fact that that the information above on your After Visit Summary has been reviewed and  is understood.  Full responsibility of the confidentiality of this discharge information lies with you and/or your care-partner.

## 2020-09-08 NOTE — Progress Notes (Signed)
Report given to PACU, vss 

## 2020-09-08 NOTE — Op Note (Signed)
[facility] Patient Name: [name] Procedure Date: 09/08/2020 11:07 AM MRN: [id number] Endoscopist: Jackquline Denmark , MD Age: 45 Referring MD:  Date of Birth: 06/14/1975 Gender: Female Account #: [id number] Procedure:                Colonoscopy Indications:              Colon cancer screening in patient at increased                            risk: Family history of colon polyp (mom and GM) Medicines:                Monitored Anesthesia Care Procedure:                Pre-Anesthesia Assessment:                           - Prior to the procedure, a History and Physical                            was performed, and patient medications and                            allergies were reviewed. The patient's tolerance of                            previous anesthesia was also reviewed. The risks                            and benefits of the procedure and the sedation                            options and risks were discussed with the patient.                            All questions were answered, and informed consent                            was obtained. Prior Anticoagulants: The patient has                            taken no previous anticoagulant or antiplatelet                            agents. ASA Grade Assessment: II - A patient with                            mild systemic disease. After reviewing the risks                            and benefits, the patient was deemed in                            satisfactory condition to undergo the procedure.  After obtaining informed consent, the colonoscope                            was passed under direct vision. Throughout the                            procedure, the patient's blood pressure, pulse, and                            oxygen saturations were monitored continuously. The                            Olympus [id number]) Colonoscope was                            introduced through  the anus and advanced to the the                            cecum, identified by appendiceal orifice and                            ileocecal valve. The colonoscopy was performed                            without difficulty. The patient tolerated the                            procedure well. The quality of the bowel                            preparation was good. The ileocecal valve,                            appendiceal orifice, and rectum were photographed. Scope In: 11:20:36 AM Scope Out: 11:37:23 AM Scope Withdrawal Time: 0 hours 10 minutes 29 seconds  Total Procedure Duration: 0 hours 16 minutes 47 seconds  Findings:                 A 2 mm polyp was found in the proximal transverse                            colon. The polyp was sessile. The polyp was removed                            with a cold biopsy forceps. Resection and retrieval                            were complete.                           Multiple medium-mouthed diverticula were found in                            the sigmoid colon and descending colon.  Non-bleeding internal hemorrhoids were found during                            retroflexion. The hemorrhoids were small.                           The exam was otherwise without abnormality on                            direct and retroflexion views. Complications:            No immediate complications. Estimated Blood Loss:     Estimated blood loss: none. Impression:               - One 2 mm polyp in the proximal transverse colon,                            removed with a cold biopsy forceps. Resected and                            retrieved.                           - Moderate left colonic diverticulosis.                           - Non-bleeding internal hemorrhoids.                           - The examination was otherwise normal on direct                            and retroflexion views. Recommendation:           - Patient has a  contact number available for                            emergencies. The signs and symptoms of potential                            delayed complications were discussed with the                            patient. Return to normal activities tomorrow.                            Written discharge instructions were provided to the                            patient.                           - Resume previous high-fiber diet.                           - Continue present medications.                           -  Await pathology results.                           - Repeat colonoscopy in 5 years for screening                            purposes d/t FH of colonic polyps.                           - The findings and recommendations were discussed                            with the patient's family. Jackquline Denmark, MD 09/08/2020 11:42:45 AM This report has been signed electronically.

## 2020-09-10 ENCOUNTER — Telehealth: Payer: Self-pay

## 2020-09-10 NOTE — Telephone Encounter (Signed)
Attempted to reach patient for post-procedure f/u call. No answer. Left message that staff will make another attempt to reach her later today and for her to please not hesitate to call us if she has any questions/concerns regarding her care.

## 2020-09-10 NOTE — Telephone Encounter (Signed)
  Follow up Call-  Call back number 09/08/2020  Post procedure Call Back phone  # [phone]  Permission to leave phone message Yes  Some recent data might be hidden     Patient questions:  Do you have a fever, pain , or abdominal swelling? No. Pain Score  0 *  Have you tolerated food without any problems? Yes.    Have you been able to return to your normal activities? Yes.    Do you have any questions about your discharge instructions: Diet   No. Medications  No. Follow up visit  No.  Do you have questions or concerns about your Care? No.  Actions: * If pain score is 4 or above: No action needed, pain <4.  Have you developed a fever since your procedure? No   2.   Have you had an respiratory symptoms (SOB or cough) since your procedure? No   3.   Have you tested positive for COVID 19 since your procedure? No   4.   Have you had any family members/close contacts diagnosed with the COVID 19 since your procedure?  No    If yes to any of these questions please route to Joylene John, RN and Joella Prince, RN

## 2020-09-12 ENCOUNTER — Encounter: Payer: Self-pay | Admitting: Gastroenterology

## 2020-11-07 DIAGNOSIS — R5383 Other fatigue: Secondary | ICD-10-CM | POA: Diagnosis not present

## 2020-11-07 DIAGNOSIS — E785 Hyperlipidemia, unspecified: Secondary | ICD-10-CM | POA: Diagnosis not present

## 2020-11-07 DIAGNOSIS — R7303 Prediabetes: Secondary | ICD-10-CM | POA: Diagnosis not present

## 2020-11-07 DIAGNOSIS — E039 Hypothyroidism, unspecified: Secondary | ICD-10-CM | POA: Diagnosis not present

## 2020-11-07 DIAGNOSIS — E611 Iron deficiency: Secondary | ICD-10-CM | POA: Diagnosis not present

## 2020-11-20 DIAGNOSIS — E039 Hypothyroidism, unspecified: Secondary | ICD-10-CM | POA: Diagnosis not present

## 2020-11-20 DIAGNOSIS — R7303 Prediabetes: Secondary | ICD-10-CM | POA: Diagnosis not present

## 2020-11-20 DIAGNOSIS — E785 Hyperlipidemia, unspecified: Secondary | ICD-10-CM | POA: Diagnosis not present

## 2020-11-20 DIAGNOSIS — R5383 Other fatigue: Secondary | ICD-10-CM | POA: Diagnosis not present

## 2021-02-20 DIAGNOSIS — G43009 Migraine without aura, not intractable, without status migrainosus: Secondary | ICD-10-CM | POA: Diagnosis not present

## 2021-02-24 DIAGNOSIS — R7303 Prediabetes: Secondary | ICD-10-CM | POA: Diagnosis not present

## 2021-02-24 DIAGNOSIS — R5383 Other fatigue: Secondary | ICD-10-CM | POA: Diagnosis not present

## 2021-02-24 DIAGNOSIS — E039 Hypothyroidism, unspecified: Secondary | ICD-10-CM | POA: Diagnosis not present

## 2021-02-24 DIAGNOSIS — E559 Vitamin D deficiency, unspecified: Secondary | ICD-10-CM | POA: Diagnosis not present

## 2021-02-24 DIAGNOSIS — E538 Deficiency of other specified B group vitamins: Secondary | ICD-10-CM | POA: Diagnosis not present

## 2021-02-24 DIAGNOSIS — R638 Other symptoms and signs concerning food and fluid intake: Secondary | ICD-10-CM | POA: Diagnosis not present

## 2021-02-24 DIAGNOSIS — E785 Hyperlipidemia, unspecified: Secondary | ICD-10-CM | POA: Diagnosis not present

## 2021-02-24 DIAGNOSIS — E663 Overweight: Secondary | ICD-10-CM | POA: Diagnosis not present

## 2021-04-23 DIAGNOSIS — Z01419 Encounter for gynecological examination (general) (routine) without abnormal findings: Secondary | ICD-10-CM | POA: Diagnosis not present

## 2021-04-23 DIAGNOSIS — Z6829 Body mass index (BMI) 29.0-29.9, adult: Secondary | ICD-10-CM | POA: Diagnosis not present

## 2021-04-23 DIAGNOSIS — Z124 Encounter for screening for malignant neoplasm of cervix: Secondary | ICD-10-CM | POA: Diagnosis not present

## 2021-04-23 DIAGNOSIS — Z1231 Encounter for screening mammogram for malignant neoplasm of breast: Secondary | ICD-10-CM | POA: Diagnosis not present

## 2021-04-23 DIAGNOSIS — Z1151 Encounter for screening for human papillomavirus (HPV): Secondary | ICD-10-CM | POA: Diagnosis not present

## 2021-04-27 DIAGNOSIS — M6248 Contracture of muscle, other site: Secondary | ICD-10-CM | POA: Diagnosis not present

## 2021-04-28 DIAGNOSIS — R5383 Other fatigue: Secondary | ICD-10-CM | POA: Diagnosis not present

## 2021-06-16 DIAGNOSIS — E039 Hypothyroidism, unspecified: Secondary | ICD-10-CM | POA: Diagnosis not present

## 2021-07-13 DIAGNOSIS — N951 Menopausal and female climacteric states: Secondary | ICD-10-CM | POA: Diagnosis not present

## 2021-07-13 DIAGNOSIS — R1084 Generalized abdominal pain: Secondary | ICD-10-CM | POA: Diagnosis not present

## 2021-08-05 DIAGNOSIS — R140 Abdominal distension (gaseous): Secondary | ICD-10-CM | POA: Diagnosis not present

## 2021-08-05 DIAGNOSIS — R102 Pelvic and perineal pain: Secondary | ICD-10-CM | POA: Diagnosis not present

## 2021-10-08 DIAGNOSIS — G43109 Migraine with aura, not intractable, without status migrainosus: Secondary | ICD-10-CM | POA: Diagnosis not present

## 2022-06-01 DIAGNOSIS — Z1283 Encounter for screening for malignant neoplasm of skin: Secondary | ICD-10-CM | POA: Diagnosis not present

## 2022-06-01 DIAGNOSIS — L820 Inflamed seborrheic keratosis: Secondary | ICD-10-CM | POA: Diagnosis not present

## 2022-06-01 DIAGNOSIS — X32XXXA Exposure to sunlight, initial encounter: Secondary | ICD-10-CM | POA: Diagnosis not present

## 2022-06-01 DIAGNOSIS — L821 Other seborrheic keratosis: Secondary | ICD-10-CM | POA: Diagnosis not present

## 2022-06-01 DIAGNOSIS — D225 Melanocytic nevi of trunk: Secondary | ICD-10-CM | POA: Diagnosis not present

## 2022-06-01 DIAGNOSIS — L570 Actinic keratosis: Secondary | ICD-10-CM | POA: Diagnosis not present

## 2022-06-08 DIAGNOSIS — Z6827 Body mass index (BMI) 27.0-27.9, adult: Secondary | ICD-10-CM | POA: Diagnosis not present

## 2022-06-08 DIAGNOSIS — Z1231 Encounter for screening mammogram for malignant neoplasm of breast: Secondary | ICD-10-CM | POA: Diagnosis not present

## 2022-06-08 DIAGNOSIS — Z01419 Encounter for gynecological examination (general) (routine) without abnormal findings: Secondary | ICD-10-CM | POA: Diagnosis not present

## 2022-06-10 DIAGNOSIS — G43009 Migraine without aura, not intractable, without status migrainosus: Secondary | ICD-10-CM | POA: Diagnosis not present

## 2022-06-10 DIAGNOSIS — Z13220 Encounter for screening for lipoid disorders: Secondary | ICD-10-CM | POA: Diagnosis not present

## 2022-06-10 DIAGNOSIS — Z0000 Encounter for general adult medical examination without abnormal findings: Secondary | ICD-10-CM | POA: Diagnosis not present

## 2022-10-08 DIAGNOSIS — B078 Other viral warts: Secondary | ICD-10-CM | POA: Diagnosis not present

## 2022-10-08 DIAGNOSIS — L84 Corns and callosities: Secondary | ICD-10-CM | POA: Diagnosis not present

## 2023-02-23 ENCOUNTER — Telehealth: Payer: Self-pay | Admitting: Gastroenterology

## 2023-02-23 NOTE — Telephone Encounter (Signed)
Inbound call from patient requesting a call to discuss blood in stool before tomorrows 1/23 appointment. Please advise, thank you.

## 2023-02-24 ENCOUNTER — Ambulatory Visit: Payer: BC Managed Care – PPO | Admitting: Nurse Practitioner

## 2023-02-24 ENCOUNTER — Encounter: Payer: Self-pay | Admitting: Nurse Practitioner

## 2023-02-24 VITALS — BP 116/68 | HR 85 | Temp 95.0°F | Ht 65.0 in | Wt 157.4 lb

## 2023-02-24 DIAGNOSIS — K625 Hemorrhage of anus and rectum: Secondary | ICD-10-CM | POA: Diagnosis not present

## 2023-02-24 DIAGNOSIS — R195 Other fecal abnormalities: Secondary | ICD-10-CM

## 2023-02-24 DIAGNOSIS — K648 Other hemorrhoids: Secondary | ICD-10-CM

## 2023-02-24 MED ORDER — HYDROCORTISONE (PERIANAL) 2.5 % EX CREA
1.0000 | TOPICAL_CREAM | Freq: Two times a day (BID) | CUTANEOUS | 0 refills | Status: AC
Start: 2023-02-24 — End: ?

## 2023-02-24 NOTE — Patient Instructions (Signed)
Call office in 1 week with updates. If bleeding worsens before then, contact office.   We have sent the following medications to your pharmacy for you to pick up at your convenience: Anusol Cream   _______________________________________________________  If your blood pressure at your visit was 140/90 or greater, please contact your primary care physician to follow up on this.  _______________________________________________________  If you are age 49 or older, your body mass index should be between 23-30. Your Body mass index is 26.19 kg/m. If this is out of the aforementioned range listed, please consider follow up with your Primary Care Provider.  If you are age 28 or younger, your body mass index should be between 19-25. Your Body mass index is 26.19 kg/m. If this is out of the aformentioned range listed, please consider follow up with your Primary Care Provider.   ________________________________________________________  The [facility] providers would like to encourage you to use [facility] to communicate with providers for non-urgent requests or questions.  Due to long hold times on the telephone, sending your provider a message by [facility] may be a faster and more efficient way to get a response.  Please allow 48 business hours for a response.  Please remember that this is for non-urgent requests.  _______________________________________________________  Thank you for choosing me and [facility].  Willette Cluster, NP

## 2023-02-24 NOTE — Telephone Encounter (Signed)
Returned patient call & she says she's been under a lot of stress since 02/11/23 and since then her bowels have not been the same. Poor appetite. In the last couple of days she's experienced bright red bleeding with her bowel movements. Denies any abdominal/rectal pain, n/v. She does have a history of int hemorrhoids, but feels this is different. Mom is a retired ED nurse who recommended Kaopectate and BRAT diet in the meantime. Pt was last seen here in 2022 with Dr. Chales Abrahams for colon, and has been advised to keep OV today with Gunnar Fusi, NP.

## 2023-02-24 NOTE — Progress Notes (Signed)
ASSESSMENT    Brief Narrative:  48 y.o.  female known to Dr. Chales Abrahams  with a past medical history not limited to colon polyps, migraines  Acute bowel change with blood in stool.  Stools unformed with some blood ( 2-3 episodes in last two days).  Change in stool consistency could be secondary to increased caffeine intake also severe stress related to family circumstances.  Bleeding could be secondary to large swollen internal hemorrhoids found on anoscopy today.  I do not think she has an infectious process nor a diverticular bleed. Abdominal exam unremarkable. VSS.  She is up-to-date on colonoscopy, last one done August 2022  See PM[facility] for any additional medical & surgical history   PLAN   --Stop coffee for now --Anusol cream PR BID x 7 days -Call us in 7 days with a condition update. Call sooner if bleeding gets worse.  --If bleeding persists, consider internal hemorrhoid banding which we briefly discussed today  HPI   Chief complaint : rectal bleeding.   [name] has been extremely stressed out since mid January about some major event involving her family. She is very tearful. She has never been through anything life this before. She hasn't been eating well. Not drinking much water.  She typically drinks a lot of creamer in her coffee but has been trying to clean up her diet and eliminate the creamer .  She loves coffee and has been sipping on black coffee all day long .   On Tuesday Kadian had a formed BM which contained red blood. On Wed morning she had another episode of the same.  Wed afternoon she had a BM that was mushy / unformed but with less amount of red blood.  Her stomach is in "knots" due to stress but otherwise no abdominal pain.     Recently took bismuth, Kaopectate and started BRAT diet and hasn't had a BM or any bleeding today.   She gives a history of chronic hemorrhoidal bleeding .  Her son has cerebral palsy and she has to pick him up and feels this contributes to  hemorrhoids .  Bleeding from the hemorrhoids is generally limited to the toilet tissue but over the last few days the blood has been also in the stool .  She does not take NSAIDs other than 1 ibuprofen within the last day or two for a sinus headache   GI History / Pertinent GI Studies   **All endoscopic studies may not be included here    Colonoscopy Aug 2022 for [facility] of colon polyps) - One 2 mm polyp in the proximal transverse colon, removed with a cold biopsy forceps. Resected and retrieved. - Moderate left colonic diverticulosis. - Non-bleeding internal hemorrhoids. - The examination was otherwise normal  Surgical [P], colon, transverse, polyp (1) - SESSILE SERRATED POLYP (1 OF 1 FRAGMENTS) - NO HIGH-GRADE DYSPLASIA OR MALIGNANCY IDENTIFIED   Past Medical History:  Diagnosis Date   Blood transfusion without reported diagnosis    Cough 08/08/2010   Genetic testing 07/09/2016   Ms. Arita Miss underwent genetic counseling and testing for hereditary cancer syndromes on 07/01/2016. Her results were negative for mutations in all 47 genes analyzed by [facility]'s 46-gene Common Hereditary Cancers Panel + NF2. Genes analyzed include: APC, ATM, AXIN2, BARD1, BMPR1A, BRCA1, BRCA2, BRIP1, CDH1, CDKN2A, CHEK2, CTNNA1, DICER1, EPCAM, GREM1, HOXB13, KIT, MEN1, MLH1, MSH2, MSH3, MSH6, MUTYH   Seasonal allergies    Thyroid disease     Past Surgical History:  Procedure Laterality Date   HEMANGIOMA EXCISION     TONSILLECTOMY      Family History  Problem Relation Age of Onset   Colon polyps Mother    Lung cancer Maternal Uncle 20       d.60s history of smoking   Colon polyps Maternal Grandmother    Uterine cancer Maternal Grandmother 72       d.93   Lung cancer Paternal Grandmother    Brain cancer Son 1       d.6 from ependymoma   Colon cancer Neg Hx    Esophageal cancer Neg Hx    Rectal cancer Neg Hx    Stomach cancer Neg Hx     Current Medications, Allergies, Family History and Social  History were reviewed in [employer] medical record.     Current Outpatient Medications  Medication Sig Dispense Refill   Ascorbic Acid (VITAMIN C PO) Take by mouth.     b complex vitamins capsule Take 1 capsule by mouth as needed.     EC-RX Testosterone 0.2 % CREA Apply 2 clicks (1/2 gram) behind the knee daily. Alternate knee each day.     EVENING PRIMROSE OIL PO Take 1 capsule by mouth at bedtime.     MAGNESIUM GLYCINATE PO Take 1 capsule by mouth daily.     Multiple Vitamins-Minerals (MULTIVITAMINS THER. W/MINERALS) TABS Take 1 tablet by mouth daily.     Probiotic Product (PROBIOTIC DAILY PO) Take by mouth.     TURMERIC CURCUMIN PO Take by mouth.     UBRELVY 100 MG TABS Take 100 mg by mouth as needed.     VITAMIN D PO Take by mouth.     Nutritional Supplements (DHEA PO) Take by mouth. (Patient not taking: Reported on 02/24/2023)     progesterone (PROMETRIUM) 100 MG capsule progesterone micronized 100 mg capsule  TAKE 1 CAPSULE BY MOUTH AT BEDTIME (Patient not taking: Reported on 02/24/2023)     thyroid (ARMOUR) 15 MG tablet Take by mouth. (Patient not taking: Reported on 02/24/2023)     Current Facility-Administered Medications  Medication Dose Route Frequency Provider Last Rate Last Admin   0.9 %  sodium chloride infusion  500 mL Intravenous Once Lynann Bologna, MD        Review of Systems: No chest pain. No shortness of breath. No urinary complaints.    Physical Exam  Filed Weights   02/24/23 1429  Weight: 157 lb 6 oz (71.4 kg)   Wt Readings from Last 3 Encounters:  02/24/23 157 lb 6 oz (71.4 kg)  09/08/20 167 lb (75.8 kg)  08/25/20 167 lb (75.8 kg)    BP 116/68 (BP Location: Left Arm, Patient Position: Sitting, Cuff Size: Normal)   Pulse 85   Temp (!) 95 F (35 C)   Ht 5\' 5"  (1.651 m)   Wt 157 lb 6 oz (71.4 kg)   BMI 26.19 kg/m  Constitutional:  Pleasant, tearful generally well appearing female in no acute distress. Psychiatric: Normal mood and  affect. Behavior is normal. EENT: Pupils normal.  Conjunctivae are normal. No scleral icterus. Neck supple.  Cardiovascular: Normal rate, regular rhythm.  Pulmonary/chest: Effort normal and breath sounds normal. No wheezing, rales or rhonchi. Abdominal: Soft, nondistended, nontender. Bowel sounds active throughout. There are no masses palpable. No hepatomegaly. Rectal: Fleshy perianal hemorrhoid tags. No fissures seen. No masses, stool or blood on DRE. On anoscopy there were large inflamed internal hemorrhoids.  Neurological: Alert and oriented to person place  and time.    Willette Cluster, NP  02/24/2023, 2:46 PM

## 2023-06-21 DIAGNOSIS — Z1329 Encounter for screening for other suspected endocrine disorder: Secondary | ICD-10-CM | POA: Diagnosis not present

## 2023-06-21 DIAGNOSIS — R319 Hematuria, unspecified: Secondary | ICD-10-CM | POA: Diagnosis not present

## 2023-06-21 DIAGNOSIS — Z13220 Encounter for screening for lipoid disorders: Secondary | ICD-10-CM | POA: Diagnosis not present

## 2023-06-21 DIAGNOSIS — Z01419 Encounter for gynecological examination (general) (routine) without abnormal findings: Secondary | ICD-10-CM | POA: Diagnosis not present

## 2023-06-21 DIAGNOSIS — Z13228 Encounter for screening for other metabolic disorders: Secondary | ICD-10-CM | POA: Diagnosis not present

## 2023-06-21 DIAGNOSIS — D649 Anemia, unspecified: Secondary | ICD-10-CM | POA: Diagnosis not present

## 2023-06-21 DIAGNOSIS — Z1321 Encounter for screening for nutritional disorder: Secondary | ICD-10-CM | POA: Diagnosis not present

## 2023-06-21 DIAGNOSIS — Z6826 Body mass index (BMI) 26.0-26.9, adult: Secondary | ICD-10-CM | POA: Diagnosis not present

## 2023-06-21 DIAGNOSIS — Z131 Encounter for screening for diabetes mellitus: Secondary | ICD-10-CM | POA: Diagnosis not present

## 2023-07-07 DIAGNOSIS — Z6827 Body mass index (BMI) 27.0-27.9, adult: Secondary | ICD-10-CM | POA: Diagnosis not present

## 2023-07-07 DIAGNOSIS — Z0000 Encounter for general adult medical examination without abnormal findings: Secondary | ICD-10-CM | POA: Diagnosis not present

## 2023-07-07 DIAGNOSIS — G43109 Migraine with aura, not intractable, without status migrainosus: Secondary | ICD-10-CM | POA: Diagnosis not present

## 2023-07-08 DIAGNOSIS — L570 Actinic keratosis: Secondary | ICD-10-CM | POA: Diagnosis not present

## 2023-07-08 DIAGNOSIS — X32XXXD Exposure to sunlight, subsequent encounter: Secondary | ICD-10-CM | POA: Diagnosis not present

## 2023-07-08 DIAGNOSIS — D225 Melanocytic nevi of trunk: Secondary | ICD-10-CM | POA: Diagnosis not present

## 2023-07-08 DIAGNOSIS — B070 Plantar wart: Secondary | ICD-10-CM | POA: Diagnosis not present

## 2023-07-08 DIAGNOSIS — Z1283 Encounter for screening for malignant neoplasm of skin: Secondary | ICD-10-CM | POA: Diagnosis not present

## 2023-09-14 DIAGNOSIS — E039 Hypothyroidism, unspecified: Secondary | ICD-10-CM | POA: Diagnosis not present

## 2023-09-14 DIAGNOSIS — Z1321 Encounter for screening for nutritional disorder: Secondary | ICD-10-CM | POA: Diagnosis not present

## 2023-09-14 DIAGNOSIS — N951 Menopausal and female climacteric states: Secondary | ICD-10-CM | POA: Diagnosis not present

## 2023-09-14 DIAGNOSIS — R7303 Prediabetes: Secondary | ICD-10-CM | POA: Diagnosis not present

## 2023-09-14 DIAGNOSIS — F419 Anxiety disorder, unspecified: Secondary | ICD-10-CM | POA: Diagnosis not present

## 2023-09-21 ENCOUNTER — Telehealth: Payer: Self-pay | Admitting: Gastroenterology

## 2023-09-21 NOTE — Telephone Encounter (Signed)
 Please see my chart messages sent on this pt.  Please review and advise.

## 2023-09-21 NOTE — Telephone Encounter (Signed)
 Pl schedule hemorrhoidal banding with Dr Jerie RG

## 2023-09-21 NOTE — Telephone Encounter (Signed)
 See TE, scheduling will reach out to patient to schedule

## 2023-09-21 NOTE — Telephone Encounter (Signed)
 PT is calling to find out if she can have a hemorrhoid banding scheduled. She has been trying to contact provider via mychart to confirm.

## 2023-09-21 NOTE — Telephone Encounter (Signed)
 Per MyChart message Pl schedule hemorrhoidal banding with Dr Jerie RG

## 2023-09-29 DIAGNOSIS — Z79890 Hormone replacement therapy (postmenopausal): Secondary | ICD-10-CM | POA: Diagnosis not present

## 2023-09-29 DIAGNOSIS — Z13820 Encounter for screening for osteoporosis: Secondary | ICD-10-CM | POA: Diagnosis not present

## 2023-10-10 DIAGNOSIS — F419 Anxiety disorder, unspecified: Secondary | ICD-10-CM | POA: Diagnosis not present

## 2023-10-10 DIAGNOSIS — R519 Headache, unspecified: Secondary | ICD-10-CM | POA: Diagnosis not present

## 2023-10-10 DIAGNOSIS — G4700 Insomnia, unspecified: Secondary | ICD-10-CM | POA: Diagnosis not present

## 2023-10-19 ENCOUNTER — Other Ambulatory Visit: Payer: Self-pay | Admitting: Obstetrics and Gynecology

## 2023-10-19 DIAGNOSIS — N6320 Unspecified lump in the left breast, unspecified quadrant: Secondary | ICD-10-CM

## 2023-11-01 ENCOUNTER — Ambulatory Visit
Admission: RE | Admit: 2023-11-01 | Discharge: 2023-11-01 | Disposition: A | Discharge status: Home / Self Care | Source: Ambulatory Visit

## 2023-11-01 ENCOUNTER — Ambulatory Visit
Admission: RE | Admit: 2023-11-01 | Discharge: 2023-11-01 | Disposition: A | Discharge status: Home / Self Care | Source: Ambulatory Visit | Attending: Obstetrics and Gynecology | Admitting: Obstetrics and Gynecology

## 2023-11-01 ENCOUNTER — Ambulatory Visit

## 2023-11-01 ENCOUNTER — Ambulatory Visit
Admission: RE | Admit: 2023-11-01 | Discharge: 2023-11-01 | Disposition: A | Source: Ambulatory Visit | Attending: Obstetrics and Gynecology | Admitting: Obstetrics and Gynecology

## 2023-11-01 ENCOUNTER — Encounter: Payer: Self-pay | Admitting: Obstetrics and Gynecology

## 2023-11-01 DIAGNOSIS — R928 Other abnormal and inconclusive findings on diagnostic imaging of breast: Secondary | ICD-10-CM | POA: Diagnosis not present

## 2023-11-01 DIAGNOSIS — N6320 Unspecified lump in the left breast, unspecified quadrant: Secondary | ICD-10-CM

## 2023-11-01 DIAGNOSIS — N6489 Other specified disorders of breast: Secondary | ICD-10-CM | POA: Diagnosis not present

## 2023-11-02 ENCOUNTER — Encounter

## 2023-11-02 ENCOUNTER — Other Ambulatory Visit

## 2023-11-24 DIAGNOSIS — N760 Acute vaginitis: Secondary | ICD-10-CM | POA: Diagnosis not present

## 2023-11-28 ENCOUNTER — Telehealth: Payer: Self-pay | Admitting: Gastroenterology

## 2023-11-28 NOTE — Telephone Encounter (Signed)
 Inbound call from patient stating she does not believe she will be able to make tomorrow's hemorrhoid banding with Dr. San. Advised patient it does not look like Dr. San does not have any more openings fore the rest of the year. Patient is requesting a call to discuss if there are any other options. Please advise, thank you

## 2023-11-28 NOTE — Telephone Encounter (Signed)
 Called & spoke with patient to reschedule hemorrhoid banding appt. Appt has been rescheduled to Wednesday, 12/07/23 at 10:20 am. Patient verbalized understanding & had no concerns at the end of the call.

## 2023-11-29 ENCOUNTER — Encounter: Admitting: Gastroenterology

## 2023-12-07 ENCOUNTER — Ambulatory Visit: Admitting: Gastroenterology

## 2023-12-07 ENCOUNTER — Encounter: Payer: Self-pay | Admitting: Gastroenterology

## 2023-12-07 VITALS — BP 90/64 | HR 92 | Ht 64.5 in | Wt 165.0 lb

## 2023-12-07 DIAGNOSIS — K641 Second degree hemorrhoids: Secondary | ICD-10-CM

## 2023-12-07 DIAGNOSIS — Z860100 Personal history of colon polyps, unspecified: Secondary | ICD-10-CM

## 2023-12-07 DIAGNOSIS — K649 Unspecified hemorrhoids: Secondary | ICD-10-CM

## 2023-12-07 DIAGNOSIS — K921 Melena: Secondary | ICD-10-CM

## 2023-12-07 NOTE — Patient Instructions (Signed)
 _______________________________________________________  If your blood pressure at your visit was 140/90 or greater, please contact your primary care physician to follow up on this.  _______________________________________________________  If you are age 50 or older, your body mass index should be between 23-30. Your Body mass index is 27.88 kg/m. If this is out of the aforementioned range listed, please consider follow up with your Primary Care Provider.  If you are age 56 or younger, your body mass index should be between 19-25. Your Body mass index is 27.88 kg/m. If this is out of the aformentioned range listed, please consider follow up with your Primary Care Provider.   ________________________________________________________  The [facility] providers would like to encourage you to use MYCHART to communicate with providers for non-urgent requests or questions.  Due to long hold times on the telephone, sending your provider a message by [facility] may be a faster and more efficient way to get a response.  Please allow 48 business hours for a response.  Please remember that this is for non-urgent requests.  _______________________________________________________  [facility] is using a team-based approach to care.  Your team is made up of your doctor and two to three APPS. Our APPS (Nurse Practitioners and Physician Assistants) work with your physician to ensure care continuity for you. They are fully qualified to address your health concerns and develop a treatment plan. They communicate directly with your gastroenterologist to care for you. Seeing the Advanced Practice Practitioners on your physician's team can help you by facilitating care more promptly, often allowing for earlier appointments, access to diagnostic testing, procedures, and other specialty referrals.   HEMORRHOID BANDING PROCEDURE    FOLLOW-UP CARE   The procedure you have had should have been relatively  painless since the banding of the area involved does not have nerve endings and there is no pain sensation.  The rubber band cuts off the blood supply to the hemorrhoid and the band may fall off as soon as 48 hours after the banding (the band may occasionally be seen in the toilet bowl following a bowel movement). You may notice a temporary feeling of fullness in the rectum which should respond adequately to plain Tylenol or Motrin.  Following the banding, avoid strenuous exercise that evening and resume full activity the next day.  A sitz bath (soaking in a warm tub) or bidet is soothing, and can be useful for cleansing the area after bowel movements.     To avoid constipation, take two tablespoons of natural wheat bran, natural oat bran, flax, Benefiber or any over the counter fiber supplement and increase your water intake to 7-8 glasses daily.    Unless you have been prescribed anorectal medication, do not put anything inside your rectum for two weeks: No suppositories, enemas, fingers, etc.  Occasionally, you may have more bleeding than usual after the banding procedure.  This is often from the untreated hemorrhoids rather than the treated one.  Don't be concerned if there is a tablespoon or so of blood.  If there is more blood than this, lie flat with your bottom higher than your head and apply an ice pack to the area. If the bleeding does not stop within a half an hour or if you feel faint, call our office at [phone] or go to the emergency room.  Problems are not common; however, if there is a substantial amount of bleeding, severe pain, chills, fever or difficulty passing urine (very rare) or other problems, you should  call us  at [phone] or report to the nearest emergency room.  Do not stay seated continuously for more than 2-3 hours for a day or two after the procedure.  Tighten your buttock muscles 10-15 times every two hours and take 10-15 deep breaths every 1-2 hours.  Do  not spend more than a few minutes on the toilet if you cannot empty your bowel; instead re-visit the toilet at a later time.   Due to recent changes in healthcare laws, you may see the results of your imaging and laboratory studies on MyChart before your provider has had a chance to review them.  We understand that in some cases there may be results that are confusing or concerning to you. Not all laboratory results come back in the same time frame and the provider may be waiting for multiple results in order to interpret others.  Please give us  48 hours in order for your provider to thoroughly review all the results before contacting the office for clarification of your results.   It was a pleasure to see you today!  Vito Cirigliano, D.O.

## 2023-12-07 NOTE — Progress Notes (Signed)
 Chief Complaint:    Symptomatic Internal Hemorrhoids; Hemorrhoid Band Ligation   HPI:     Patient is a 48 y.o. female with a history of symptomatic internal hemorrhoids referred to me by Dr. Charlanne for hemorrhoid banding. The patient presents with symptomatic grade 2-3 hemorrhoids, unresponsive to maximal medical therapy, requesting rubber band ligation of symptomatic hemorrhoidal disease.  Was seen in January 2025 for exacerbation of hemorrhoids which coincided with diarrhea and GI symptoms 2/2 increased family stressors.  Having no symptoms, but still with intermittent hemorrhoidal symptoms described as BRBPR.  89 year old son with cerebral palsy whom she lives multiple times per day which undoubtably exacerbates her hemorrhoids.  - Colonoscopy Aug 2022 (for [facility] of colon polyps): 2 mm transverse colon sessile serrated polyp, moderate left-sided diverticulosis, internal hemorrhoids.   Review of systems:     No chest pain, no SOB, no fevers, no urinary sx   Past Medical History:  Diagnosis Date   Blood transfusion without reported diagnosis    Cough 08/08/2010   Genetic testing 07/09/2016   Ms. Letha underwent genetic counseling and testing for hereditary cancer syndromes on 07/01/2016. Her results were negative for mutations in all 47 genes analyzed by [facility]'s 46-gene Common Hereditary Cancers Panel + NF2. Genes analyzed include: APC, ATM, AXIN2, BARD1, BMPR1A, BRCA1, BRCA2, BRIP1, CDH1, CDKN2A, CHEK2, CTNNA1, DICER1, EPCAM, GREM1, HOXB13, KIT, MEN1, MLH1, MSH2, MSH3, MSH6, MUTYH   Seasonal allergies    Thyroid  disease     Patient's surgical history, family medical history, social history, medications and allergies were all reviewed in Epic    Current Outpatient Medications  Medication Sig Dispense Refill   Ascorbic Acid (VITAMIN C PO) Take by mouth.     b complex vitamins capsule Take 1 capsule by mouth as needed.     estradiol  (VIVELLE -DOT) 0.0375 MG/24HR Place 1 patch onto  the skin 2 (two) times a week.     EVENING PRIMROSE OIL PO Take 1 capsule by mouth at bedtime.     hydrocortisone  (ANUSOL -HC) 2.5 % rectal cream Place 1 Application rectally 2 (two) times daily. 30 g 0   MAGNESIUM GLYCINATE PO Take 1 capsule by mouth daily.     Multiple Vitamins-Minerals (MULTIVITAMINS THER. W/MINERALS) TABS Take 1 tablet by mouth daily.     Nutritional Supplements (DHEA PO) Take by mouth.     Probiotic Product (PROBIOTIC DAILY PO) Take by mouth.     progesterone  (PROMETRIUM ) 100 MG capsule progesterone  micronized 100 mg capsule  TAKE 1 CAPSULE BY MOUTH AT BEDTIME     thyroid  (ARMOUR) 15 MG tablet Take by mouth.     TURMERIC CURCUMIN PO Take by mouth.     UBRELVY 100 MG TABS Take 100 mg by mouth as needed.     VITAMIN D PO Take by mouth.     zinc  gluconate 50 MG tablet Take 50 mg by mouth as needed.     Current Facility-Administered Medications  Medication Dose Route Frequency Provider Last Rate Last Admin   0.9 %  sodium chloride  infusion  500 mL Intravenous Once [name], MD        Physical Exam:     BP 90/64 (BP Location: Left Arm, Patient Position: Sitting, Cuff Size: Normal)   Pulse 92   Ht 5' 4.5 (1.638 m) Comment: height measured without shoes  Wt 165 lb (74.8 kg)   BMI 27.88 kg/m   GENERAL:  Pleasant female in NAD PSYCH: : Cooperative, normal affect NEURO: Alert and oriented x  3, no focal neurologic deficits Rectal exam: Sensation intact and preserved anal wink.  Grade 2 hemorrhoids noted in RP and LL positions and grade 1 hemorrhoid in RA position on anoscopy.  No external anal fissures noted. Normal sphincter tone. No palpable mass. No blood on the exam glove. (Chaperone: Nat Sic, CMA).   IMPRESSION and PLAN:    #1.  Symptomatic internal hemorrhoids: PROCEDURE NOTE: The patient presents with symptomatic grade 2 hemorrhoids, unresponsive to maximal medical therapy, requesting rubber band ligation of symptomatic hemorrhoidal disease.  All  risks, benefits and alternative forms of therapy were described and informed consent was obtained.  In the Left Lateral Decubitus position, anoscopic examination revealed grade 2 hemorrhoids in the RP and LL and grade 1 hemorrhoid in RA position(s).  The anorectum was pre-medicated with RectiCare. The decision was made to band the RP internal hemorrhoid, and the [facility] System was used to perform band ligation without complication.  Digital anorectal examination was then performed to assure proper positioning of the band, and to adjust the banded tissue as required.  The patient was discharged home without pain or other issues.  Dietary and behavioral recommendations were given and along with follow-up instructions.     The following adjunctive treatments were recommended:  -Resume high-fiber diet with fiber supplement (i.e. Citrucel or Benefiber) with goal for soft stools without straining to have a BM. -Resume adequate fluid intake.  The patient will return in 4+ weeks for follow-up and possible additional banding as required (planned banding of LL hemorrhoid column). No complications were encountered and the patient tolerated the procedure well.      #2.  History of colon polyps - Repeat colonoscopy in 09/2025 for ongoing polyp surveillance      Sandor LULLA Flatter ,DO, FACG 12/07/2023, 10:31 AM

## 2023-12-12 DIAGNOSIS — E559 Vitamin D deficiency, unspecified: Secondary | ICD-10-CM | POA: Diagnosis not present

## 2023-12-12 DIAGNOSIS — Z131 Encounter for screening for diabetes mellitus: Secondary | ICD-10-CM | POA: Diagnosis not present

## 2023-12-12 DIAGNOSIS — R946 Abnormal results of thyroid function studies: Secondary | ICD-10-CM | POA: Diagnosis not present

## 2023-12-12 DIAGNOSIS — R6882 Decreased libido: Secondary | ICD-10-CM | POA: Diagnosis not present

## 2023-12-12 DIAGNOSIS — E538 Deficiency of other specified B group vitamins: Secondary | ICD-10-CM | POA: Diagnosis not present

## 2023-12-12 DIAGNOSIS — N951 Menopausal and female climacteric states: Secondary | ICD-10-CM | POA: Diagnosis not present

## 2023-12-12 DIAGNOSIS — R5383 Other fatigue: Secondary | ICD-10-CM | POA: Diagnosis not present

## 2024-01-03 DIAGNOSIS — R519 Headache, unspecified: Secondary | ICD-10-CM | POA: Diagnosis not present

## 2024-01-03 DIAGNOSIS — M2550 Pain in unspecified joint: Secondary | ICD-10-CM | POA: Diagnosis not present

## 2024-01-03 DIAGNOSIS — F419 Anxiety disorder, unspecified: Secondary | ICD-10-CM | POA: Diagnosis not present

## 2024-01-03 DIAGNOSIS — G4700 Insomnia, unspecified: Secondary | ICD-10-CM | POA: Diagnosis not present

## 2024-01-12 ENCOUNTER — Encounter: Admitting: Gastroenterology

## 2024-01-16 ENCOUNTER — Encounter: Admitting: Gastroenterology

## 2024-02-15 ENCOUNTER — Encounter: Payer: Self-pay | Admitting: Gastroenterology

## 2024-02-15 ENCOUNTER — Ambulatory Visit: Admitting: Gastroenterology

## 2024-02-15 VITALS — BP 110/64 | HR 71 | Ht 64.25 in | Wt 164.0 lb

## 2024-02-15 DIAGNOSIS — K641 Second degree hemorrhoids: Secondary | ICD-10-CM | POA: Diagnosis not present

## 2024-02-15 DIAGNOSIS — K649 Unspecified hemorrhoids: Secondary | ICD-10-CM

## 2024-02-15 DIAGNOSIS — Z860100 Personal history of colon polyps, unspecified: Secondary | ICD-10-CM

## 2024-02-15 NOTE — Patient Instructions (Signed)
 _______________________________________________________  If your blood pressure at your visit was 140/90 or greater, please contact your primary care physician to follow up on this.  _______________________________________________________  If you are age 51 or older, your body mass index should be between 23-30. Your Body mass index is 27.93 kg/m. If this is out of the aforementioned range listed, please consider follow up with your Primary Care Provider.  If you are age 59 or younger, your body mass index should be between 19-25. Your Body mass index is 27.93 kg/m. If this is out of the aformentioned range listed, please consider follow up with your Primary Care Provider.   ________________________________________________________  The [facility] providers would like to encourage you to use MYCHART to communicate with providers for non-urgent requests or questions.  Due to long hold times on the telephone, sending your provider a message by [facility] may be a faster and more efficient way to get a response.  Please allow 48 business hours for a response.  Please remember that this is for non-urgent requests.  _______________________________________________________  [facility] is using a team-based approach to care.  Your team is made up of your doctor and two to three APPS. Our APPS (Nurse Practitioners and Physician Assistants) work with your physician to ensure care continuity for you. They are fully qualified to address your health concerns and develop a treatment plan. They communicate directly with your gastroenterologist to care for you. Seeing the Advanced Practice Practitioners on your physician's team can help you by facilitating care more promptly, often allowing for earlier appointments, access to diagnostic testing, procedures, and other specialty referrals.   HEMORRHOID BANDING PROCEDURE    FOLLOW-UP CARE   The procedure you have had should have been relatively  painless since the banding of the area involved does not have nerve endings and there is no pain sensation.  The rubber band cuts off the blood supply to the hemorrhoid and the band may fall off as soon as 48 hours after the banding (the band may occasionally be seen in the toilet bowl following a bowel movement). You may notice a temporary feeling of fullness in the rectum which should respond adequately to plain Tylenol or Motrin.  Following the banding, avoid strenuous exercise that evening and resume full activity the next day.  A sitz bath (soaking in a warm tub) or bidet is soothing, and can be useful for cleansing the area after bowel movements.     To avoid constipation, take two tablespoons of natural wheat bran, natural oat bran, flax, Benefiber or any over the counter fiber supplement and increase your water intake to 7-8 glasses daily.    Unless you have been prescribed anorectal medication, do not put anything inside your rectum for two weeks: No suppositories, enemas, fingers, etc.  Occasionally, you may have more bleeding than usual after the banding procedure.  This is often from the untreated hemorrhoids rather than the treated one.  Dont be concerned if there is a tablespoon or so of blood.  If there is more blood than this, lie flat with your bottom higher than your head and apply an ice pack to the area. If the bleeding does not stop within a half an hour or if you feel faint, call our office at [phone] or go to the emergency room.  Problems are not common; however, if there is a substantial amount of bleeding, severe pain, chills, fever or difficulty passing urine (very rare) or other problems, you should  call us  at [phone] or report to the nearest emergency room.  Do not stay seated continuously for more than 2-3 hours for a day or two after the procedure.  Tighten your buttock muscles 10-15 times every two hours and take 10-15 deep breaths every 1-2 hours.  Do  not spend more than a few minutes on the toilet if you cannot empty your bowel; instead re-visit the toilet at a later time.    It was a pleasure to see you today!  [name], D.O.

## 2024-02-15 NOTE — Progress Notes (Signed)
 "  Chief Complaint:    Symptomatic Internal Hemorrhoids; Hemorrhoid Band Ligation   HPI:     Patient is a 49 y.o. female with a history of symptomatic internal hemorrhoids referred to me by Dr. Charlanne for hemorrhoid banding. The patient presents with symptomatic grade 2-3 hemorrhoids, unresponsive to maximal medical therapy, requesting rubber band ligation of symptomatic hemorrhoidal disease.   Was seen in January 2025 for exacerbation of hemorrhoids which coincided with diarrhea and GI symptoms 2/2 increased family stressors.  Having no symptoms, but still with intermittent hemorrhoidal symptoms described as BRBPR.  76 year old son with cerebral palsy whom she lifts multiple times per day which undoubtably exacerbates her hemorrhoids.   - Colonoscopy Aug 2022 (for [name]): 2 mm transverse colon sessile serrated polyp, moderate left-sided diverticulosis, internal hemorrhoids.  - 12/07/2023: Banding or RP hemorrhoid  Had good response to hemorrhoid banding. Still with intermittent BRBPR, but overall improved since first banding.  Otherwise no change in medical or surgical history, medications, allergies, social history since last appointment with me.   Review of systems:     No chest pain, no SOB, no fevers, no urinary sx   Past Medical History:  Diagnosis Date   Blood transfusion without reported diagnosis    Cough 08/08/2010   Genetic testing 07/09/2016   [name] underwent genetic counseling and testing for hereditary cancer syndromes on 07/01/2016. Her results were negative for mutations in all 47 genes analyzed by [facility]'s 46-gene Common Hereditary Cancers Panel + NF2. Genes analyzed include: APC, ATM, AXIN2, BARD1, BMPR1A, BRCA1, BRCA2, BRIP1, CDH1, CDKN2A, CHEK2, CTNNA1, DICER1, EPCAM, GREM1, HOXB13, KIT, MEN1, MLH1, MSH2, MSH3, MSH6, MUTYH   Seasonal allergies    Thyroid  disease     Patient's surgical history, family medical history, social history, medications  and allergies were all reviewed in Epic    Current Outpatient Medications  Medication Sig Dispense Refill   Ascorbic Acid (VITAMIN C PO) Take by mouth. (Patient taking differently: Take by mouth. Takes prn)     b complex vitamins capsule Take 1 capsule by mouth as needed.     estradiol  (VIVELLE -DOT) 0.0375 MG/24HR Place 1 patch onto the skin 2 (two) times a week.     EVENING PRIMROSE OIL PO Take 1 capsule by mouth at bedtime.     hydrocortisone  (ANUSOL -HC) 2.5 % rectal cream Place 1 Application rectally 2 (two) times daily. 30 g 0   MAGNESIUM GLYCINATE PO Take 1 capsule by mouth daily.     Multiple Vitamins-Minerals (MULTIVITAMINS THER. W/MINERALS) TABS Take 1 tablet by mouth daily.     NP THYROID  30 MG tablet Take 30 mg by mouth every morning.     Nutritional Supplements (DHEA PO) Take by mouth.     Probiotic Product (PROBIOTIC DAILY PO) Take by mouth.     progesterone  (PROMETRIUM ) 200 MG capsule Take 200 mg by mouth at bedtime.     TURMERIC CURCUMIN PO Take by mouth.     UBRELVY 100 MG TABS Take 100 mg by mouth as needed.     VITAMIN D PO Take by mouth.     zinc  gluconate 50 MG tablet Take 50 mg by mouth as needed.     Current Facility-Administered Medications  Medication Dose Route Frequency Provider Last Rate Last Admin   0.9 %  sodium chloride  infusion  500 mL Intravenous Once [name], MD        Physical Exam:     BP 110/64   Pulse 71  Ht 5' 4.25 (1.632 m)   Wt 164 lb (74.4 kg)   BMI 27.93 kg/m   GENERAL:  Pleasant female in NAD PSYCH: : Cooperative, normal affect NEURO: Alert and oriented x 3, no focal neurologic deficits Rectal exam: Sensation intact and preserved anal wink.  Grade 2 hemorrhoids noted in LL position on exam.   No external anal fissures noted. Normal sphincter tone. No palpable mass. No blood on the exam glove. (Chaperone: PJ, CMA).   IMPRESSION and PLAN:    #1.  Symptomatic internal hemorrhoids: PROCEDURE NOTE: The patient presents with  symptomatic grade 2 to2  hemorrhoids, unresponsive to maximal medical therapy, requesting rubber band ligation of symptomatic hemorrhoidal disease.  All risks, benefits and alternative forms of therapy were described and informed consent was obtained.  In the Left Lateral Decubitus position, anoscopic examination revealed grade 2 hemorrhoids in the LL position(s).  The anorectum was pre-medicated with RectiCare. The decision was made to band the LL internal hemorrhoid, and the [facility] ORegan System was used to perform band ligation without complication.  Digital anorectal examination was then performed to assure proper positioning of the band, and to adjust the banded tissue as required.  The patient was discharged home without pain or other issues.  Dietary and behavioral recommendations were given and along with follow-up instructions.     The following adjunctive treatments were recommended:  -Resume high-fiber diet with fiber supplement (i.e. Citrucel or Benefiber) with goal for soft stools without straining to have a BM. -Resume adequate fluid intake.  The patient will return as needed if return of hemorrhoidal symptoms for reevaluation and additional banding as appropriate. No complications were encountered and the patient tolerated the procedure well.       #2.  History of colon [name] - Repeat colonoscopy in 09/2025 for ongoing polyp surveillance  Can otherwise resume any ongoing GI care with Dr. Charlanne.  Can follow-up with me on an as needed basis.      [name] ,DO, FACG 02/15/2024, 9:50 AM  "
